# Patient Record
Sex: Male | Born: 1940 | Race: White | Marital: Married | Smoking: Former smoker
Health system: Southern US, Community
[De-identification: ages and names within clinical notes are randomized; demographics above are authoritative.]

## PROBLEM LIST (undated history)

## (undated) DIAGNOSIS — I82409 Acute embolism and thrombosis of unspecified deep veins of unspecified lower extremity: Secondary | ICD-10-CM

## (undated) DIAGNOSIS — I8390 Asymptomatic varicose veins of unspecified lower extremity: Secondary | ICD-10-CM

## (undated) DIAGNOSIS — I739 Peripheral vascular disease, unspecified: Secondary | ICD-10-CM

## (undated) DIAGNOSIS — I10 Essential (primary) hypertension: Secondary | ICD-10-CM

## (undated) DIAGNOSIS — G4700 Insomnia, unspecified: Secondary | ICD-10-CM

## (undated) DIAGNOSIS — I6529 Occlusion and stenosis of unspecified carotid artery: Secondary | ICD-10-CM

## (undated) DIAGNOSIS — D649 Anemia, unspecified: Secondary | ICD-10-CM

## (undated) DIAGNOSIS — M7071 Other bursitis of hip, right hip: Secondary | ICD-10-CM

## (undated) DIAGNOSIS — K5090 Crohn's disease, unspecified, without complications: Secondary | ICD-10-CM

## (undated) DIAGNOSIS — D126 Benign neoplasm of colon, unspecified: Secondary | ICD-10-CM

## (undated) DIAGNOSIS — R748 Abnormal levels of other serum enzymes: Secondary | ICD-10-CM

## (undated) DIAGNOSIS — N200 Calculus of kidney: Secondary | ICD-10-CM

## (undated) DIAGNOSIS — M1990 Unspecified osteoarthritis, unspecified site: Secondary | ICD-10-CM

## (undated) DIAGNOSIS — K219 Gastro-esophageal reflux disease without esophagitis: Secondary | ICD-10-CM

## (undated) HISTORY — DX: Other bursitis of hip, right hip: M70.71

## (undated) HISTORY — DX: Crohn's disease, unspecified, without complications: K50.90

## (undated) HISTORY — DX: Abnormal levels of other serum enzymes: R74.8

## (undated) HISTORY — DX: Gastro-esophageal reflux disease without esophagitis: K21.9

## (undated) HISTORY — PX: KNEE SURGERY: SHX244

## (undated) HISTORY — DX: Acute embolism and thrombosis of unspecified deep veins of unspecified lower extremity: I82.409

## (undated) HISTORY — DX: Asymptomatic varicose veins of unspecified lower extremity: I83.90

## (undated) HISTORY — DX: Insomnia, unspecified: G47.00

## (undated) HISTORY — DX: Benign neoplasm of colon, unspecified: D12.6

## (undated) HISTORY — DX: Occlusion and stenosis of unspecified carotid artery: I65.29

## (undated) HISTORY — DX: Unspecified osteoarthritis, unspecified site: M19.90

## (undated) HISTORY — PX: TONSILLECTOMY: SUR1361

## (undated) HISTORY — DX: Essential (primary) hypertension: I10

## (undated) HISTORY — PX: COLONOSCOPY: SHX174

## (undated) HISTORY — PX: COLON SURGERY: SHX602

## (undated) HISTORY — DX: Peripheral vascular disease, unspecified: I73.9

## (undated) HISTORY — DX: Anemia, unspecified: D64.9

---

## 2012-03-16 HISTORY — PX: ESOPHAGOGASTRODUODENOSCOPY: SHX1529

## 2013-04-07 ENCOUNTER — Other Ambulatory Visit: Payer: Self-pay | Admitting: *Deleted

## 2013-04-07 ENCOUNTER — Encounter: Payer: Self-pay | Admitting: Vascular Surgery

## 2013-04-07 DIAGNOSIS — I70219 Atherosclerosis of native arteries of extremities with intermittent claudication, unspecified extremity: Secondary | ICD-10-CM

## 2013-04-18 ENCOUNTER — Encounter: Payer: Self-pay | Admitting: Vascular Surgery

## 2013-04-19 ENCOUNTER — Encounter: Payer: Self-pay | Admitting: Vascular Surgery

## 2013-04-19 ENCOUNTER — Ambulatory Visit (INDEPENDENT_AMBULATORY_CARE_PROVIDER_SITE_OTHER)
Admission: RE | Admit: 2013-04-19 | Discharge: 2013-04-19 | Disposition: A | Discharge status: Home / Self Care | Payer: Medicare Other | Source: Ambulatory Visit | Attending: Vascular Surgery | Admitting: Vascular Surgery

## 2013-04-19 ENCOUNTER — Ambulatory Visit (HOSPITAL_COMMUNITY)
Admission: RE | Admit: 2013-04-19 | Discharge: 2013-04-19 | Disposition: A | Discharge status: Home / Self Care | Payer: Medicare Other | Source: Ambulatory Visit | Attending: Vascular Surgery | Admitting: Vascular Surgery

## 2013-04-19 ENCOUNTER — Ambulatory Visit (INDEPENDENT_AMBULATORY_CARE_PROVIDER_SITE_OTHER): Payer: Medicare Other | Admitting: Vascular Surgery

## 2013-04-19 VITALS — BP 135/76 | HR 53 | Wt 194.8 lb

## 2013-04-19 DIAGNOSIS — I70219 Atherosclerosis of native arteries of extremities with intermittent claudication, unspecified extremity: Secondary | ICD-10-CM

## 2013-04-19 DIAGNOSIS — I739 Peripheral vascular disease, unspecified: Secondary | ICD-10-CM | POA: Insufficient documentation

## 2013-04-19 DIAGNOSIS — Z87891 Personal history of nicotine dependence: Secondary | ICD-10-CM | POA: Insufficient documentation

## 2013-04-19 DIAGNOSIS — Z09 Encounter for follow-up examination after completed treatment for conditions other than malignant neoplasm: Secondary | ICD-10-CM | POA: Insufficient documentation

## 2013-04-19 DIAGNOSIS — I10 Essential (primary) hypertension: Secondary | ICD-10-CM | POA: Insufficient documentation

## 2013-04-19 NOTE — Assessment & Plan Note (Signed)
This patient has stable bilateral lower extremity claudication which is more significant on the right side. He has developed some recurrent stenosis in a previously stented segment of his right superficial femoral artery. Fortunately he is not a smoker. He seems highly motivated and intelligent and I think he will do well with continued exercise and a structured walking program. I've explained that the risk of reintervention is that certainly he could develop recurrent stenosis again. I've explained that we would generally reserve bypass for patients with rest pain or a nonhealing ulcer. I be happy to see him back at any time if his symptoms progress or he develops any new vascular issues.

## 2013-04-19 NOTE — Progress Notes (Signed)
Vascular and Vein Specialist of [facility]  Patient name: Ryan Richmond MRN: [id number] DOB: 1940-12-28 Sex: male  REASON FOR CONSULT: Second opinion concerning peripheral vascular disease  HPI: Ryan Richmond is a 73 y.o. male who underwent atherectomy, angioplasty and stenting of the right superficial femoral artery by Dr. Maryjean Morn in [facility] on 07/21/2012. This was for lower extremity claudication. Initially his symptoms improved, however, approximately 2-3 months later he developed recurrent right calf claudication. Follow up studies air showed evidence of recurrent disease in the stented segment. He was considering having further work but decided to hold off and then a second opinion.  He has bilateral lower extremity calf claudication. His symptoms are more significant on the right side. He develops calf pain at approximately 1/8 of a mile. His symptoms are aggravated by walking and relieved with rest. He denies any history of rest pain or nonhealing ulcers. He is not a smoker. His symptoms have been relatively stable over the last several months.  I have reviewed [place] records from [place]. On 07/21/2012, he underwent a right superficial femoral artery atherectomy with angioplasty and stenting. He had an excellent result. This was for a 70% stenosis at the adductor canal. Below that he had some mild tibial disease. On the left side, he had some mild tibial disease.  After the procedure, on 07/28/2012, he had an ABI of 100% on the right and 100% on the left. The stent was widely patent without evidence of increased velocities.   Past Medical History  Diagnosis Date  . Peripheral vascular disease   . Anemia   . Arthritis   . Benign neoplasm of colon   . Bursitis of right hip   . Chronic kidney disease   . Crohn's disease   . DVT (deep venous thrombosis)   . Esophageal reflux   . Hypertension   . Elevated liver enzymes   . Peripheral arterial disease   . Carotid artery occlusion    right carotid bruit  . Varicose veins   . Insomnia, unspecified   FAMILY HISTORY: There is no family history of premature cardiovascular disease.  SOCIAL HISTORY: History  Substance Use Topics  . Smoking status: Former Smoker    Quit date: 03/17/1983  . Smokeless tobacco: Never Used  . Alcohol Use: 1.8 oz/week    3 Shots of liquor per week   No Known Allergies Current Outpatient Prescriptions  Medication Sig Dispense Refill  . acidophilus (RISAQUAD) CAPS capsule Take by mouth daily.      Marland Kitchen aspirin 81 MG tablet Take 81 mg by mouth daily.      . cloNIDine (CATAPRES) 0.1 MG tablet Take 0.1 mg by mouth 2 (two) times daily.      . ferrous sulfate 325 (65 FE) MG tablet Take 325 mg by mouth daily with breakfast.      . Multiple Vitamins-Minerals (VITEYES AREDS ADVANCED PO) Take 1 tablet by mouth 2 (two) times daily.      Marland Kitchen olmesartan-hydrochlorothiazide (BENICAR HCT) 40-25 MG per tablet Take 1 tablet by mouth daily.      . Probiotic Product (ALIGN PO) Take 1 tablet by mouth daily.      . Turmeric Curcumin 500 MG CAPS Take 500 mg by mouth daily.      Marland Kitchen zolpidem (AMBIEN) 5 MG tablet Take 5 mg by mouth at bedtime as needed for sleep.      . rifaximin (XIFAXAN) 550 MG TABS tablet Take 550 mg by mouth 3 (three) times  daily.       No current facility-administered medications for this visit.   REVIEW OF SYSTEMS: Valu.Nieves ] denotes positive finding; [  ] denotes negative finding  CARDIOVASCULAR:  [ ]  chest pain   [ ]  chest pressure   [ ]  palpitations   [ ]  orthopnea   [ ]  dyspnea on exertion   Valu.Nieves ] claudication- bilateral   [ ]  rest pain   Valu.Nieves ] DVT   [ ]  phlebitis PULMONARY:   [ ]  productive cough   [ ]  asthma   [ ]  wheezing NEUROLOGIC:   [ ]  weakness  [ ]  paresthesias  [ ]  aphasia  [ ]  amaurosis  [ ]  dizziness HEMATOLOGIC:   [ ]  bleeding problems   [ ]  clotting disorders MUSCULOSKELETAL:  [ ]  joint pain   [ ]  joint swelling [ ]  leg swelling GASTROINTESTINAL: [ ]   blood in stool  [ ]    hematemesis GENITOURINARY:  [ ]   dysuria  [ ]   hematuria PSYCHIATRIC:  [ ]  history of major depression INTEGUMENTARY:  [ ]  rashes  [ ]  ulcers CONSTITUTIONAL:  [ ]  fever   [ ]  chills  PHYSICAL EXAM: Filed Vitals:   04/19/13 1016  BP: 135/76  Pulse: 53  Weight: 194 lb 12.8 oz (88.361 kg)  SpO2: 100%   There is no height on file to calculate BMI. GENERAL: The patient is a well-nourished male, in no acute distress. The vital signs are documented above. CARDIOVASCULAR: There is a regular rate and rhythm. I do not detect carotid bruits. He has palpable femoral pulses. Both feet are warm and well-perfused. I cannot palpate pedal pulses. PULMONARY: There is good air exchange bilaterally without wheezing or rales. ABDOMEN: Soft and non-tender with normal pitched bowel sounds.  MUSCULOSKELETAL: There are no major deformities or cyanosis. NEUROLOGIC: No focal weakness or paresthesias are detected. SKIN: There are no ulcers or rashes noted. PSYCHIATRIC: The patient has a normal affect.  DATA:  I have independently interpreted his arterial Doppler study in our office today. He has monophasic signals throughout the stented area in the right superficial femoral artery. ABIs 97% on the right and 100% on the left. Velocities in the right superficial femoral artery suggests a greater than 50% stenosis.  MEDICAL ISSUES:  Atherosclerosis of native arteries of the extremities with intermittent claudication This patient has stable bilateral lower extremity claudication which is more significant on the right side. He has developed some recurrent stenosis in a previously stented segment of his right superficial femoral artery. Fortunately he is not a smoker. He seems highly motivated and intelligent and I think he will do well with continued exercise and a structured walking program. I've explained that the risk of reintervention is that certainly he could develop recurrent stenosis again. I've explained that  we would generally reserve bypass for patients with rest pain or a nonhealing ulcer. I be happy to see him back at any time if his symptoms progress or he develops any new vascular issues.   [place] [facility] [place] Beeper: [phone]

## 2013-07-31 ENCOUNTER — Ambulatory Visit (INDEPENDENT_AMBULATORY_CARE_PROVIDER_SITE_OTHER): Payer: Medicare Other | Admitting: Interventional Cardiology

## 2013-07-31 ENCOUNTER — Encounter: Payer: Self-pay | Admitting: Interventional Cardiology

## 2013-07-31 VITALS — BP 124/60 | HR 63 | Ht 72.0 in | Wt 194.0 lb

## 2013-07-31 DIAGNOSIS — I70219 Atherosclerosis of native arteries of extremities with intermittent claudication, unspecified extremity: Secondary | ICD-10-CM

## 2013-07-31 DIAGNOSIS — I739 Peripheral vascular disease, unspecified: Secondary | ICD-10-CM

## 2013-07-31 DIAGNOSIS — I10 Essential (primary) hypertension: Secondary | ICD-10-CM | POA: Insufficient documentation

## 2013-07-31 DIAGNOSIS — R0989 Other specified symptoms and signs involving the circulatory and respiratory systems: Secondary | ICD-10-CM

## 2013-07-31 NOTE — Patient Instructions (Signed)
Your physician recommends that you continue on your current medications as directed. Please refer to the Current Medication list given to you today.  We will request a copy of your nuclear stress test form [facility]  Your total cholesterol goal should be less than 170, LDL 70 or less  Your physician recommends that you schedule a follow-up appointment as needed

## 2013-07-31 NOTE — Progress Notes (Signed)
Patient ID: Ryan Richmond, male   DOB: 04-18-1940, 73 y.o.   MRN: [id number]   Date: 07/31/2013 ID: Ryan Richmond, DOB 04/07/1940, MRN [id number] PCP: Doug Sou B  Reason: Claudication  ASSESSMENT;  1. Peripheral vascular disease with right greater than left lower extremity claudication 2. Vague, at times continuous chest discomfort that can last for days, located in the left pectoral region.  3. Hypertension 4. Hyperlipidemia  PLAN:  1. Pharmacologic nuclear stress test [place] within the past 6 months. Our review the results. Not on any new testing until that has been reviewed. 2. Risk factor modification including achieving an LDL cholesterol of 70 or lower 3. Aerobic activity 4. May consider anatomic evaluation of the coronaries with CT angioma or cardiac cath depending upon my review of his nuclear study. If it is truly normal, no further evaluation at this time and just clinical followup   SUBJECTIVE: Ryan Richmond is a 73 y.o. male who is here for evaluation of vague left pectoral chest discomfort. Is not exertion related. There is no associated dyspnea , radiation, or other complaints. He has had off and on for 18 months. He had a stress nuclear study performed at [facility] about 6 months ago that was "normal". He is here for second opinion. The quality of the discomfort has not changed over the last 1-1/2 years. He plays softball. His only limitation with walking in physical activity his right calf claudication. He has a history of right superficial femoral artery stenting. He has bilateral lower extremity claudication. Claudication gives him trouble although with prolonged walking up inclines. He is able to run first base without difficulty. No neurological complaints. No history of cerebrovascular disease   No Known Allergies  Current Outpatient Prescriptions on File Prior to Visit  Medication Sig Dispense Refill  . aspirin 81 MG tablet Take 81  mg by mouth daily.      . cloNIDine (CATAPRES) 0.1 MG tablet Take 0.05 mg by mouth 2 (two) times daily.       . ferrous sulfate 325 (65 FE) MG tablet Take 325 mg by mouth daily with breakfast.      . Multiple Vitamins-Minerals (VITEYES AREDS ADVANCED PO) Take 1 tablet by mouth 2 (two) times daily.      Marland Kitchen olmesartan-hydrochlorothiazide (BENICAR HCT) 40-25 MG per tablet Take 1 tablet by mouth daily.      . Turmeric Curcumin 500 MG CAPS Take 500 mg by mouth daily.      Marland Kitchen zolpidem (AMBIEN) 5 MG tablet Take 5 mg by mouth at bedtime as needed for sleep.       No current facility-administered medications on file prior to visit.    Past Medical History  Diagnosis Date  . Peripheral vascular disease   . Anemia   . Arthritis   . Benign neoplasm of colon   . Bursitis of right hip   . Chronic kidney disease   . Crohn's disease   . DVT (deep venous thrombosis)   . Esophageal reflux   . Hypertension   . Elevated liver enzymes   . Peripheral arterial disease   . Carotid artery occlusion     right carotid bruit  . Varicose veins   . Insomnia, unspecified     Past Surgical History  Procedure Laterality Date  . Colonoscopy  2009 and 2011  . Esophagogastroduodenoscopy  january  2014  . Knee surgery Left   . Tonsillectomy      History  Social History  . Marital Status: Married    Spouse Name: N/A    Number of Children: N/A  . Years of Education: N/A   Occupational History  . Not on file.   Social History Main Topics  . Smoking status: Former Smoker    Quit date: 03/17/1983  . Smokeless tobacco: Never Used  . Alcohol Use: 1.8 oz/week    3 Shots of liquor per week  . Drug Use: No  . Sexual Activity: Not on file   Other Topics Concern  . Not on file   Social History Narrative  . No narrative on file    No family history on file.  ROS: No lower extremity sores. No history of cardiac events. Denies syncope. Denies tachycardia palpitations.. Other systems negative for  complaints.  OBJECTIVE: BP 124/60  Pulse 63  Ht 6' (1.829 m)  Wt 194 lb (87.998 kg)  BMI 26.31 kg/m2,  General: No acute distress, appears healthy and younger than stated age 51: normal  Neck: JVD flat. Carotids absent Chest: Clear Cardiac: Murmur: Absent. Gallop: Absent. Rhythm: Normal. Other: Normal Abdomen: Bruit: Normal. Pulsation: Absent Extremities: Edema: Absent to trace bilateral. Pulses: 2+ and symmetric Neuro: Normal Psych: Normal  ECG: Normal

## 2018-05-25 ENCOUNTER — Emergency Department (HOSPITAL_BASED_OUTPATIENT_CLINIC_OR_DEPARTMENT_OTHER): Payer: Medicare Other

## 2018-05-25 ENCOUNTER — Observation Stay (HOSPITAL_BASED_OUTPATIENT_CLINIC_OR_DEPARTMENT_OTHER)
Admission: EM | Admit: 2018-05-25 | Discharge: 2018-05-26 | Disposition: A | Discharge status: Home / Self Care | Payer: Medicare Other | Attending: Internal Medicine | Admitting: Internal Medicine

## 2018-05-25 ENCOUNTER — Other Ambulatory Visit: Payer: Self-pay

## 2018-05-25 ENCOUNTER — Encounter (HOSPITAL_BASED_OUTPATIENT_CLINIC_OR_DEPARTMENT_OTHER): Payer: Self-pay

## 2018-05-25 DIAGNOSIS — Z7982 Long term (current) use of aspirin: Secondary | ICD-10-CM | POA: Diagnosis not present

## 2018-05-25 DIAGNOSIS — I10 Essential (primary) hypertension: Secondary | ICD-10-CM | POA: Diagnosis present

## 2018-05-25 DIAGNOSIS — R079 Chest pain, unspecified: Secondary | ICD-10-CM | POA: Diagnosis present

## 2018-05-25 DIAGNOSIS — R072 Precordial pain: Secondary | ICD-10-CM | POA: Diagnosis not present

## 2018-05-25 DIAGNOSIS — I739 Peripheral vascular disease, unspecified: Secondary | ICD-10-CM | POA: Insufficient documentation

## 2018-05-25 DIAGNOSIS — Z87891 Personal history of nicotine dependence: Secondary | ICD-10-CM | POA: Diagnosis not present

## 2018-05-25 DIAGNOSIS — Z79899 Other long term (current) drug therapy: Secondary | ICD-10-CM | POA: Insufficient documentation

## 2018-05-25 DIAGNOSIS — R0789 Other chest pain: Secondary | ICD-10-CM

## 2018-05-25 HISTORY — DX: Calculus of kidney: N20.0

## 2018-05-25 LAB — BASIC METABOLIC PANEL
Anion gap: 8 (ref 5–15)
BUN: 21 mg/dL (ref 8–23)
CHLORIDE: 104 mmol/L (ref 98–111)
CO2: 23 mmol/L (ref 22–32)
Calcium: 9.2 mg/dL (ref 8.9–10.3)
Creatinine, Ser: 1.34 mg/dL — ABNORMAL HIGH (ref 0.61–1.24)
GFR, EST AFRICAN AMERICAN: 60 mL/min — AB (ref >60)
GFR, EST NON AFRICAN AMERICAN: 51 mL/min — AB (ref >60)
Glucose, Bld: 141 mg/dL — ABNORMAL HIGH (ref 70–99)
POTASSIUM: 3.8 mmol/L (ref 3.5–5.1)
Sodium: 135 mmol/L (ref 135–145)

## 2018-05-25 LAB — CBC
HEMATOCRIT: 42.6 % (ref 39.0–52.0)
HEMOGLOBIN: 13.9 g/dL (ref 13.0–17.0)
MCH: 30.9 pg (ref 26.0–34.0)
MCHC: 32.6 g/dL (ref 30.0–36.0)
MCV: 94.7 fL (ref 80.0–100.0)
Platelets: 209 10*3/uL (ref 150–400)
RBC: 4.5 MIL/uL (ref 4.22–5.81)
RDW: 12.6 % (ref 11.5–15.5)
WBC: 7.5 10*3/uL (ref 4.0–10.5)
nRBC: 0 % (ref 0.0–0.2)

## 2018-05-25 LAB — TROPONIN I: Troponin I: <0.03 ng/mL (ref <0.03)

## 2018-05-25 MED ORDER — ATORVASTATIN CALCIUM 10 MG PO TABS
10.0000 mg | ORAL_TABLET | Freq: Every day | ORAL | Status: DC
  Administered 2018-05-25 – 2018-05-26 (×2): 10 mg via ORAL
  Filled 2018-05-25 (×2): qty 1

## 2018-05-25 MED ORDER — OLMESARTAN MEDOXOMIL-HCTZ 40-25 MG PO TABS
1.0000 | ORAL_TABLET | Freq: Every day | ORAL | Status: DC
Start: 2018-05-25 — End: 2018-05-25

## 2018-05-25 MED ORDER — ONDANSETRON HCL 4 MG/2ML IJ SOLN: 4.0000 mg | Freq: Four times a day (QID) | INTRAMUSCULAR | Status: DC | PRN

## 2018-05-25 MED ORDER — HEPARIN (PORCINE) 25000 UT/250ML-% IV SOLN
1250.0000 [IU]/h | INTRAVENOUS | Status: DC
  Administered 2018-05-25: 1100 [IU]/h via INTRAVENOUS
  Filled 2018-05-25: qty 250

## 2018-05-25 MED ORDER — ACETAMINOPHEN 325 MG PO TABS: 650.0000 mg | ORAL_TABLET | ORAL | Status: DC | PRN

## 2018-05-25 MED ORDER — ASPIRIN EC 81 MG PO TBEC
81.0000 mg | DELAYED_RELEASE_TABLET | Freq: Every day | ORAL | Status: DC
  Administered 2018-05-26: 81 mg via ORAL
  Filled 2018-05-25: qty 1

## 2018-05-25 MED ORDER — FINASTERIDE 5 MG PO TABS
5.0000 mg | ORAL_TABLET | Freq: Every day | ORAL | Status: DC
  Administered 2018-05-26: 5 mg via ORAL
  Filled 2018-05-25 (×2): qty 1

## 2018-05-25 MED ORDER — TURMERIC CURCUMIN 500 MG PO CAPS: 500.0000 mg | ORAL_CAPSULE | Freq: Every day | ORAL | Status: DC

## 2018-05-25 MED ORDER — SODIUM CHLORIDE 0.9% FLUSH
3.0000 mL | Freq: Once | INTRAVENOUS | Status: DC
  Filled 2018-05-25: qty 3

## 2018-05-25 MED ORDER — FAMOTIDINE 20 MG PO TABS
40.0000 mg | ORAL_TABLET | Freq: Every day | ORAL | Status: DC
  Administered 2018-05-26: 40 mg via ORAL
  Filled 2018-05-25: qty 2

## 2018-05-25 MED ORDER — CLONIDINE HCL 0.1 MG PO TABS
0.0500 mg | ORAL_TABLET | Freq: Two times a day (BID) | ORAL | Status: DC
  Administered 2018-05-25 – 2018-05-26 (×2): 0.05 mg via ORAL
  Filled 2018-05-25 (×3): qty 1

## 2018-05-25 MED ORDER — ZOLPIDEM TARTRATE 5 MG PO TABS
5.0000 mg | ORAL_TABLET | Freq: Every evening | ORAL | Status: DC | PRN
  Administered 2018-05-25: 5 mg via ORAL
  Filled 2018-05-25: qty 1

## 2018-05-25 MED ORDER — ASPIRIN 81 MG PO CHEW
324.0000 mg | CHEWABLE_TABLET | Freq: Once | ORAL | Status: AC
Start: 2018-05-25 — End: 2018-05-25
  Administered 2018-05-25: 324 mg via ORAL
  Filled 2018-05-25: qty 4

## 2018-05-25 MED ORDER — HYDROCHLOROTHIAZIDE 25 MG PO TABS
25.0000 mg | ORAL_TABLET | Freq: Every day | ORAL | Status: DC
  Administered 2018-05-26: 25 mg via ORAL
  Filled 2018-05-25: qty 1

## 2018-05-25 MED ORDER — IRBESARTAN 150 MG PO TABS
300.0000 mg | ORAL_TABLET | Freq: Every day | ORAL | Status: DC
  Administered 2018-05-26: 300 mg via ORAL
  Filled 2018-05-25: qty 2

## 2018-05-25 NOTE — ED Notes (Signed)
ED TO INPATIENT HANDOFF REPORT  ED Nurse Name and Phone #:Tata Lilia Pro [phone]  S Name/Age/Gender Ryan Richmond 78 y.o. male Room/Bed: MH10/MH10  Code Status   Code Status: Not on file  Home/SNF/Other  Home Is this baseline? yes  Triage Complete: Triage complete  Chief Complaint chest pain  Triage Note C/o left side chest pressure x 3 weeks-NAD-steady gait   Allergies No Known Allergies  Level of Care/Admitting Diagnosis ED Disposition    ED Disposition Condition [place]: [place] [[id number]]  Level of Care: Cardiac Telemetry [103]  I expect the patient will be discharged within 24 hours: Yes  LOW acuity---Tx typically complete <24 hrs---ACUTE conditions typically can be evaluated <24 hours---LABS likely to return to acceptable levels <24 hours---IS near functional baseline---EXPECTED to return to current living arrangement---NOT newly hypoxic: Meets criteria for 5C-Observation unit  Diagnosis: Chest pain [098119]  Admitting Physician: Karmen Bongo [[id number]]  Attending Physician: Karmen Bongo [[id number]]  PT Class (Do Not Modify): Observation [104]  PT Acc Code (Do Not Modify): Observation [10022]       B Medical/Surgery History Past Medical History:  Diagnosis Date  . Anemia   . Arthritis   . Benign neoplasm of colon   . Bursitis of right hip   . Carotid artery occlusion    right carotid bruit  . Crohn's disease ([place])   . DVT (deep venous thrombosis) ([place])   . Elevated liver enzymes   . Esophageal reflux   . Hypertension   . Insomnia, unspecified   . Kidney stone   . Peripheral arterial disease ([place])   . Peripheral vascular disease ([place])   . Varicose veins    Past Surgical History:  Procedure Laterality Date  . COLON SURGERY    . COLONOSCOPY  2009 and 2011  . ESOPHAGOGASTRODUODENOSCOPY  january  2014  . KNEE SURGERY Left   . TONSILLECTOMY       A IV Location/Drains/Wounds Patient  Lines/Drains/Airways Status   Active Line/Drains/Airways    Name:   Placement date:   Placement time:   Site:   Days:   Peripheral IV 05/25/18 Left Antecubital   05/25/18    1401    Antecubital   less than 1          Intake/Output Last 24 hours No intake or output data in the 24 hours ending 05/25/18 1840  Labs/Imaging Results for orders placed or performed during the hospital encounter of 05/25/18 (from the past 48 hour(s))  Basic metabolic panel     Status: Abnormal   Collection Time: 05/25/18  2:04 PM  Result Value Ref Range   Sodium 135 135 - 145 mmol/L   Potassium 3.8 3.5 - 5.1 mmol/L   Chloride 104 98 - 111 mmol/L   CO2 23 22 - 32 mmol/L   Glucose, Bld 141 (H) 70 - 99 mg/dL   BUN 21 8 - 23 mg/dL   Creatinine, Ser 1.34 (H) 0.61 - 1.24 mg/dL   Calcium 9.2 8.9 - 10.3 mg/dL   GFR calc non Af Amer 51 (L) >60 mL/min   GFR calc Af Amer 60 (L) >60 mL/min   Anion gap 8 5 - 15    Comment: Performed at [facility], [address] [place], [place] [address]  CBC     Status: None   Collection Time: 05/25/18  2:04 PM  Result Value Ref Range   WBC 7.5 4.0 - 10.5 K/uL  RBC 4.50 4.22 - 5.81 MIL/uL   Hemoglobin 13.9 13.0 - 17.0 g/dL   HCT 42.6 39.0 - 52.0 %   MCV 94.7 80.0 - 100.0 fL   MCH 30.9 26.0 - 34.0 pg   MCHC 32.6 30.0 - 36.0 g/dL   RDW 12.6 11.5 - 15.5 %   Platelets 209 150 - 400 K/uL   nRBC 0.0 0.0 - 0.2 %    Comment: Performed at [facility], [address] [place], [place] [address]  Troponin I - ONCE - STAT     Status: None   Collection Time: 05/25/18  2:04 PM  Result Value Ref Range   Troponin I <0.03 <0.03 ng/mL    Comment: Performed at [facility], [address] [place], [place] [address]   Dg Chest 2 View  Result Date: 05/25/2018 CLINICAL DATA:  Subacute onset of left chest pressure for 3 weeks. EXAM: CHEST - 2 VIEW COMPARISON:  None. FINDINGS: The lungs are well-aerated. Mild peribronchial thickening is noted. There is  no evidence of focal opacification, pleural effusion or pneumothorax. The heart is normal in size; the mediastinal contour is within normal limits. No acute osseous abnormalities are seen. IMPRESSION: Mild peribronchial thickening noted. Lungs otherwise clear. Electronically Signed   By: Garald Balding M.D.   On: 05/25/2018 14:00    Pending Labs Unresulted Labs (From admission, onward)   None      Vitals/Pain Today's Vitals   05/25/18 1448 05/25/18 1500 05/25/18 1655 05/25/18 1817  BP:  (!) 156/82  (!) 146/80  Pulse:  69  62  Resp:  19  18  Temp:    97.8 F (36.6 C)  TempSrc:    Oral  SpO2:  100%  100%  Weight:      Height:      PainSc: 3   3      Isolation Precautions No active isolations  Medications Medications  sodium chloride flush (NS) 0.9 % injection 3 mL (3 mLs Intravenous Not Given 05/25/18 1400)  aspirin chewable tablet 324 mg (324 mg Oral Given 05/25/18 1502)    Mobility walks Low fall risk   Focused Assessments Sinus rhythm of monitor   R Recommendations: See Admitting Provider Note  Report given to: Minette Brine, RN  Additional Notes:  Still complaint of left chest pressure.

## 2018-05-25 NOTE — Progress Notes (Signed)
ANTICOAGULATION CONSULT NOTE - Initial Consult  Pharmacy Consult for heparin Indication: chest pain/ACS  No Known Allergies  Patient Measurements: Height: 5\' 11"  (180.3 cm) Weight: 191 lb 8 oz (86.9 kg) IBW/kg (Calculated) : 75.3 Heparin Dosing Weight: 87 kg   Vital Signs: Temp: 98 F (36.7 C) (03/11 2053) Temp Source: Oral (03/11 2053) BP: 185/74 (03/11 2053) Pulse Rate: 65 (03/11 2053)  Labs: Recent Labs    05/25/18 1404  HGB 13.9  HCT 42.6  PLT 209  CREATININE 1.34*  TROPONINI <0.03    Estimated Creatinine Clearance: 50.7 mL/min (A) (by C-G formula based on SCr of 1.34 mg/dL (H)).   Medical History: Past Medical History:  Diagnosis Date  . Anemia   . Arthritis   . Benign neoplasm of colon   . Bursitis of right hip   . Carotid artery occlusion    right carotid bruit  . Crohn's disease ([place])   . DVT (deep venous thrombosis) ([place])   . Elevated liver enzymes   . Esophageal reflux   . Hypertension   . Insomnia, unspecified   . Kidney stone   . Peripheral arterial disease ([place])   . Peripheral vascular disease ([place])   . Varicose veins     Medications:  Medications Prior to Admission  Medication Sig Dispense Refill Last Dose  . atorvastatin (LIPITOR) 10 MG tablet Take 10 mg by mouth daily.   05/24/2018 at 2200  . cloNIDine (CATAPRES) 0.1 MG tablet Take 0.05 mg by mouth 2 (two) times daily.    05/25/2018 at 0630  . famotidine (PEPCID) 40 MG tablet Take 40 mg by mouth daily.   05/25/2018 at 0630  . finasteride (PROSCAR) 5 MG tablet TAKE 1 TABLET BY MOUTH EVERY DAY     . olmesartan-hydrochlorothiazide (BENICAR HCT) 40-25 MG per tablet Take 1 tablet by mouth daily.   05/25/2018 at 0630  . Turmeric Curcumin 500 MG CAPS Take 500 mg by mouth daily.   05/25/2018 at 0630  . aspirin 81 MG tablet Take 81 mg by mouth daily.   Unknown at Unknown time  . ferrous sulfate 325 (65 FE) MG tablet Take 325 mg by mouth daily with breakfast.   Taking  . Multiple Vitamins-Minerals  (VITEYES AREDS ADVANCED PO) Take 1 tablet by mouth 2 (two) times daily.   Taking  . Probiotic Product ([place]) Take by mouth daily.   Taking  . zolpidem (AMBIEN) 5 MG tablet Take 5 mg by mouth at bedtime as needed for sleep.   05/16/2018    Assessment: 77 YOM with chest pain x 3 weeks. Of note he is on Xarelto at home but has not taken his dose today. Pharmacy consulted to start IV heparin for ACS. H/H and Plt wnl   Goal of Therapy:  Heparin level 0.3-0.7 units/ml Monitor platelets by anticoagulation protocol: Yes   Plan:  -Start IV heparin 1100 units/hr. No bolus -F/u 8 hr HL/aPTT -Monitor daily aPTT/HL, CBC and s/s of bleeding   Albertina Parr, PharmD., BCPS Clinical Pharmacist Clinical phone for 05/25/18 until 10:30pm: [phone] If after 10:30pm, please refer to [facility] for unit-specific pharmacist

## 2018-05-25 NOTE — Plan of Care (Signed)
  Problem: Health Behavior/Discharge Planning: Goal: Ability to manage health-related needs will improve Outcome: Progressing   Problem: Clinical Measurements: Goal: Ability to maintain clinical measurements within normal limits will improve Outcome: Progressing Goal: Will remain free from infection Outcome: Progressing   Problem: Clinical Measurements: Goal: Will remain free from infection Outcome: Progressing

## 2018-05-25 NOTE — ED Notes (Signed)
ED Provider at bedside. 

## 2018-05-25 NOTE — ED Provider Notes (Signed)
[facility] EMERGENCY DEPARTMENT Provider Note   CSN: [id number] Arrival date & time: 05/25/18  1326    History   Chief Complaint Chief Complaint  Patient presents with   Chest Pain    HPI Ryan Richmond is a 78 y.o. male.     Patient with a complaint of chest pain on and off for approximately 3 weeks.  Described as a pressure but then this morning it was more intense.  Is left substernal area and left chest.  Not made worse by movement of the arm does not go into the back.  No other symptoms with it.  Patient has a history of hypertension and peripheral vascular disease.  Patient is on the blood thinner Xarelto.  No shortness of breath with it.  Pain here spontaneously resolved.  But had pain for several hours today.     Past Medical History:  Diagnosis Date   Anemia    Arthritis    Benign neoplasm of colon    Bursitis of right hip    Carotid artery occlusion    right carotid bruit   Crohn's disease ([place])    DVT (deep venous thrombosis) (HCC)    Elevated liver enzymes    Esophageal reflux    Hypertension    Insomnia, unspecified    Kidney stone    Peripheral arterial disease ([place])    Peripheral vascular disease ([place])    Varicose veins     Patient Active Problem List   Diagnosis Date Noted   Claudication ([place]) 07/31/2013   Carotid bruit 07/31/2013   Essential hypertension 07/31/2013   Atherosclerosis of native arteries of the extremities with intermittent claudication 04/19/2013    Past Surgical History:  Procedure Laterality Date   COLON SURGERY     COLONOSCOPY  2009 and 2011   ESOPHAGOGASTRODUODENOSCOPY  january  2014   KNEE SURGERY Left    TONSILLECTOMY          Home Medications    Prior to Admission medications   Medication Sig Start Date End Date Taking? Authorizing Provider  atorvastatin (LIPITOR) 10 MG tablet Take 10 mg by mouth daily.   Yes [provider]  cloNIDine (CATAPRES) 0.1 MG tablet Take  0.05 mg by mouth 2 (two) times daily.    Yes [provider]  famotidine (PEPCID) 40 MG tablet Take 40 mg by mouth daily.   Yes [provider]  finasteride (PROSCAR) 5 MG tablet TAKE 1 TABLET BY MOUTH EVERY DAY 09/14/16  Yes [provider]  olmesartan-hydrochlorothiazide (BENICAR HCT) 40-25 MG per tablet Take 1 tablet by mouth daily.   Yes [provider]  Turmeric Curcumin 500 MG CAPS Take 500 mg by mouth daily.   Yes [provider]  aspirin 81 MG tablet Take 81 mg by mouth daily.    [provider]  ferrous sulfate 325 (65 FE) MG tablet Take 325 mg by mouth daily with breakfast.    [provider]  Multiple Vitamins-Minerals (VITEYES AREDS ADVANCED PO) Take 1 tablet by mouth 2 (two) times daily.    [provider]  Probiotic Product ([place]) Take by mouth daily.    [provider]  zolpidem (AMBIEN) 5 MG tablet Take 5 mg by mouth at bedtime as needed for sleep.    [provider]    Family History Family History  Problem Relation Age of Onset   Heart disease Father     Social History Social History  Tobacco Use   Smoking status: Former Smoker    Last attempt to quit: 03/17/1983    Years since quitting: 35.2   Smokeless tobacco: Never Used  Substance Use Topics   Alcohol use: Yes    Comment: occ   Drug use: No     Allergies   Patient has no known allergies.   Review of Systems Review of Systems  Constitutional: Negative for chills and fever.  HENT: Negative for rhinorrhea and sore throat.   Eyes: Negative for visual disturbance.  Respiratory: Negative for cough and shortness of breath.   Cardiovascular: Positive for chest pain. Negative for leg swelling.  Gastrointestinal: Negative for abdominal pain, diarrhea, nausea and vomiting.  Genitourinary: Negative for dysuria.  Musculoskeletal: Negative for back pain and neck pain.  Skin: Negative for rash.    Neurological: Negative for dizziness, light-headedness and headaches.  Hematological: Bruises/bleeds easily.  Psychiatric/Behavioral: Negative for confusion.     Physical Exam Updated Vital Signs BP (!) 156/82    Pulse 69    Temp 98.1 F (36.7 C) (Oral)    Resp 19    Ht 1.803 m (5\' 11" )    Wt 89.4 kg    SpO2 100%    BMI 27.48 kg/m   Physical Exam Vitals signs and nursing note reviewed.  Constitutional:      Appearance: He is well-developed.  HENT:     Head: Normocephalic and atraumatic.  Eyes:     Conjunctiva/sclera: Conjunctivae normal.  Neck:     Musculoskeletal: Normal range of motion and neck supple.  Cardiovascular:     Rate and Rhythm: Normal rate and regular rhythm.     Heart sounds: No murmur.  Pulmonary:     Effort: Pulmonary effort is normal. No respiratory distress.     Breath sounds: Normal breath sounds.  Chest:     Chest wall: No tenderness.  Abdominal:     General: Bowel sounds are normal.     Palpations: Abdomen is soft.     Tenderness: There is no abdominal tenderness.  Musculoskeletal: Normal range of motion.        General: No swelling.  Skin:    General: Skin is warm and dry.     Capillary Refill: Capillary refill takes less than 2 seconds.  Neurological:     General: No focal deficit present.     Mental Status: He is alert and oriented to person, place, and time.      ED Treatments / Results  Labs (all labs ordered are listed, but only abnormal results are displayed) Labs Reviewed  BASIC METABOLIC PANEL - Abnormal; Notable for the following components:      Result Value   Glucose, Bld 141 (*)    Creatinine, Ser 1.34 (*)    GFR calc non Af Amer 51 (*)    GFR calc Af Amer 60 (*)    All other components within normal limits  CBC  TROPONIN I    EKG EKG Interpretation  Date/Time:  Wednesday May 25 2018 13:31:46 EDT Ventricular Rate:  67 PR Interval:  180 QRS Duration: 100 QT Interval:  428 QTC Calculation: 452 R  Axis:   43 Text Interpretation:  Normal sinus rhythm Normal ECG No previous ECGs available Confirmed by Fredia Sorrow [phone]) on 05/25/2018 1:36:00 PM   Radiology Dg Chest 2 View  Result Date: 05/25/2018 CLINICAL DATA:  Subacute onset of left chest pressure for 3 weeks. EXAM: CHEST - 2 VIEW COMPARISON:  None. FINDINGS: The  lungs are well-aerated. Mild peribronchial thickening is noted. There is no evidence of focal opacification, pleural effusion or pneumothorax. The heart is normal in size; the mediastinal contour is within normal limits. No acute osseous abnormalities are seen. IMPRESSION: Mild peribronchial thickening noted. Lungs otherwise clear. Electronically Signed   By: Garald Balding M.D.   On: 05/25/2018 14:00    Procedures Procedures (including critical care time)  Medications Ordered in ED Medications  sodium chloride flush (NS) 0.9 % injection 3 mL (3 mLs Intravenous Not Given 05/25/18 1400)  aspirin chewable tablet 324 mg (324 mg Oral Given 05/25/18 1502)     Initial Impression / Assessment and Plan / ED Course  I have reviewed the triage vital signs and the nursing notes.  Pertinent labs & imaging results that were available during my care of the patient were reviewed by me and considered in my medical decision making (see chart for details).       Patient's heart score is 5.  Patient with cardiac risk factors.  Pain is now resolved.  Patient was given aspirin however the pain resolved before that.  Patient is EKG without any acute changes.  Chest x-ray negative initial troponin negative.  But symptoms concerning for an acute cardiac syndrome.  Based on his risk factors heart score 5 patient needs admission and rule out.  Discussed with hospitalist who will admit to [facility].   Final Clinical Impressions(s) / ED Diagnoses   Final diagnoses:  Precordial pain    ED Discharge Orders    None       Fredia Sorrow, MD 05/25/18 1615

## 2018-05-25 NOTE — H&P (Signed)
History and Physical   [name] OFB:[id number] DOB: 07-20-1940 DOA: 05/25/2018  Referring MD/NP/PA: Dr. Bobby Rumpf  PCP: Nicola Girt, DO   Outpatient Specialists: None  Patient coming from: Home  Chief Complaint: Chest pain  HPI: Ryan Richmond is a 78 y.o. male with medical history significant of peripheral vascular disease, hypertension, hyperlipidemia who was seen at [facility] secondary to left-sided chest pain on and off for 3 weeks but got worse today.  Pain rated as 7 out of 10.  More pressure.  No radiation.  Worsened by activities not relieved by rest.  Patient had no prior history of coronary artery disease.  No significant family history but has risk factors as indicated above.  He was therefore sent over for rule out MI.  He is still having 1 out of 10 pain at the moment.  Responded to nitroglycerin.  Patient denied any significant history of GERD..  ED Course: Blood pressure is 185/74 otherwise the rest of the vitals are stable.  BUN and creatinine is 21 and 1.34.  The regular CBC and chemistry appeared to be normal.  Initial cardiac enzymes negative.  EKG showed no significant findings.  Patient being admitted for observation and MI ruled out.  Review of Systems: As per HPI otherwise 10 point review of systems negative.    Past Medical History:  Diagnosis Date  . Anemia   . Arthritis   . Benign neoplasm of colon   . Bursitis of right hip   . Carotid artery occlusion    right carotid bruit  . Crohn's disease ([place])   . DVT (deep venous thrombosis) ([place])   . Elevated liver enzymes   . Esophageal reflux   . Hypertension   . Insomnia, unspecified   . Kidney stone   . Peripheral arterial disease ([place])   . Peripheral vascular disease ([place])   . Varicose veins     Past Surgical History:  Procedure Laterality Date  . COLON SURGERY    . COLONOSCOPY  2009 and 2011  . ESOPHAGOGASTRODUODENOSCOPY  january  2014  . KNEE SURGERY Left   . TONSILLECTOMY       reports that he quit smoking about 35 years ago. He has never used smokeless tobacco. He reports current alcohol use. He reports that he does not use drugs.  No Known Allergies  Family History  Problem Relation Age of Onset  . Heart disease Father      Prior to Admission medications   Medication Sig Start Date End Date Taking? Authorizing Provider  atorvastatin (LIPITOR) 10 MG tablet Take 10 mg by mouth daily.   Yes [provider]  cloNIDine (CATAPRES) 0.1 MG tablet Take 0.05 mg by mouth 2 (two) times daily.    Yes [provider]  famotidine (PEPCID) 40 MG tablet Take 40 mg by mouth daily.   Yes [provider]  finasteride (PROSCAR) 5 MG tablet TAKE 1 TABLET BY MOUTH EVERY DAY 09/14/16  Yes [provider]  olmesartan-hydrochlorothiazide (BENICAR HCT) 40-25 MG per tablet Take 1 tablet by mouth daily.   Yes [provider]  Turmeric Curcumin 500 MG CAPS Take 500 mg by mouth daily.   Yes [provider]  aspirin 81 MG tablet Take 81 mg by mouth daily.    [provider]  ferrous sulfate 325 (65 FE) MG tablet Take 325 mg by mouth daily with breakfast.    [provider]  Multiple Vitamins-Minerals (VITEYES AREDS ADVANCED PO) Take  1 tablet by mouth 2 (two) times daily.    [provider]  Probiotic Product ([place]) Take by mouth daily.    [provider]  zolpidem (AMBIEN) 5 MG tablet Take 5 mg by mouth at bedtime as needed for sleep.    [provider]    Physical Exam: Vitals:   05/25/18 1430 05/25/18 1500 05/25/18 1817 05/25/18 2053  BP: 122/80 (!) 156/82 (!) 146/80 (!) 185/74  Pulse: 63 69 62 65  Resp: 18 19 18 18   Temp:   97.8 F (36.6 C) 98 F (36.7 C)  TempSrc:   Oral Oral  SpO2: 100% 100% 100% 100%  Weight:    86.9 kg  Height:    5\' 11"  (1.803 m)      Constitutional: NAD, calm, comfortable Vitals:   05/25/18 1430 05/25/18 1500 05/25/18 1817 05/25/18  2053  BP: 122/80 (!) 156/82 (!) 146/80 (!) 185/74  Pulse: 63 69 62 65  Resp: 18 19 18 18   Temp:   97.8 F (36.6 C) 98 F (36.7 C)  TempSrc:   Oral Oral  SpO2: 100% 100% 100% 100%  Weight:    86.9 kg  Height:    5\' 11"  (1.803 m)   Eyes: PERRL, lids and conjunctivae normal ENMT: Mucous membranes are moist. Posterior pharynx clear of any exudate or lesions.Normal dentition.  Neck: normal, supple, no masses, no thyromegaly Respiratory: clear to auscultation bilaterally, no wheezing, no crackles. Normal respiratory effort. No accessory muscle use.  Cardiovascular: Regular rate and rhythm, no murmurs / rubs / gallops. No extremity edema. 2+ pedal pulses. No carotid bruits.  Abdomen: no tenderness, no masses palpated. No hepatosplenomegaly. Bowel sounds positive.  Musculoskeletal: no clubbing / cyanosis. No joint deformity upper and lower extremities. Good ROM, no contractures. Normal muscle tone.  Skin: no rashes, lesions, ulcers. No induration Neurologic: CN 2-12 grossly intact. Sensation intact, DTR normal. Strength 5/5 in all 4.  Psychiatric: Normal judgment and insight. Alert and oriented x 3. Normal mood.     Labs on Admission: I have personally reviewed following labs and imaging studies  CBC: Recent Labs  Lab 05/25/18 1404 05/26/18 0255  WBC 7.5 8.2  HGB 13.9 13.2  HCT 42.6 38.9*  MCV 94.7 91.1  PLT 209 170   Basic Metabolic Panel: Recent Labs  Lab 05/25/18 1404  NA 135  K 3.8  CL 104  CO2 23  GLUCOSE 141*  BUN 21  CREATININE 1.34*  CALCIUM 9.2   GFR: Estimated Creatinine Clearance: 50.7 mL/min (A) (by C-G formula based on SCr of 1.34 mg/dL (H)). Liver Function Tests: No results for input(s): AST, ALT, ALKPHOS, BILITOT, PROT, ALBUMIN in the last 168 hours. No results for input(s): LIPASE, AMYLASE in the last 168 hours. No results for input(s): AMMONIA in the last 168 hours. Coagulation Profile: No results for input(s): INR, PROTIME in the last 168 hours.  Cardiac Enzymes: Recent Labs  Lab 05/25/18 1404 05/25/18 2118 05/25/18 2355 05/26/18 0255  TROPONINI <0.03 <0.03 <0.03 <0.03   BNP (last 3 results) No results for input(s): PROBNP in the last 8760 hours. HbA1C: No results for input(s): HGBA1C in the last 72 hours. CBG: No results for input(s): GLUCAP in the last 168 hours. Lipid Profile: No results for input(s): CHOL, HDL, LDLCALC, TRIG, CHOLHDL, LDLDIRECT in the last 72 hours. Thyroid Function Tests: No results for input(s): TSH, T4TOTAL, FREET4, T3FREE, THYROIDAB in the last 72 hours. Anemia Panel: No results for input(s): VITAMINB12, FOLATE,  FERRITIN, TIBC, IRON, RETICCTPCT in the last 72 hours. Urine analysis: No results found for: COLORURINE, APPEARANCEUR, LABSPEC, PHURINE, GLUCOSEU, HGBUR, BILIRUBINUR, KETONESUR, PROTEINUR, UROBILINOGEN, NITRITE, LEUKOCYTESUR Sepsis Labs: @LABRCNTIP (procalcitonin:4,lacticidven:4) )No results found for this or any previous visit (from the past 240 hour(s)).   Radiological Exams on Admission: Dg Chest 2 View  Result Date: 05/25/2018 CLINICAL DATA:  Subacute onset of left chest pressure for 3 weeks. EXAM: CHEST - 2 VIEW COMPARISON:  None. FINDINGS: The lungs are well-aerated. Mild peribronchial thickening is noted. There is no evidence of focal opacification, pleural effusion or pneumothorax. The heart is normal in size; the mediastinal contour is within normal limits. No acute osseous abnormalities are seen. IMPRESSION: Mild peribronchial thickening noted. Lungs otherwise clear. Electronically Signed   By: Garald Balding M.D.   On: 05/25/2018 14:00    EKG: Independently reviewed.  Normal sinus rhythm with a rate of 67 no significant ST changes  Assessment/Plan Principal Problem:   Chest pain Active Problems:   Essential hypertension     #1 chest pain: Patient has risk factors including hypertension and possibly hyperlipidemia.  Will admit for observation and cycle enzymes.  Initiate  heparin drip with nitroglycerin.  Patient may be a candidate for inpatient or outpatient cardiac stress testing if negative.  #2 hypertension: Resume blood pressure from home and adjust as necessary.  #3 hyperlipidemia: May need statin.   DVT prophylaxis: Heparin Code Status: Full Family Communication: No family available Disposition Plan: Home Consults called: None Admission status: Observation  Severity of Illness: The appropriate patient status for this patient is OBSERVATION. Observation status is judged to be reasonable and necessary in order to provide the required intensity of service to ensure the patient's safety. The patient's presenting symptoms, physical exam findings, and initial radiographic and laboratory data in the context of their medical condition is felt to place them at decreased risk for further clinical deterioration. Furthermore, it is anticipated that the patient will be medically stable for discharge from the hospital within 2 midnights of admission. The following factors support the patient status of observation.   " The patient's presenting symptoms include chest pain. " The physical exam findings include no significant findings on exam. " The initial radiographic and laboratory data are normal EKG and troponin.     Barbette Merino MD [facility] Pager [phone]  If 7PM-7AM, please contact night-coverage [email or web address] Password [email or web address]  05/26/2018, 6:01 AM

## 2018-05-25 NOTE — ED Triage Notes (Signed)
C/o left side chest pressure x 3 weeks-NAD-steady gait

## 2018-05-25 NOTE — Progress Notes (Signed)
Called from [facility] regarding this patient with h/o PVD with stent; HTN; and Crohn's disease presenting with chest pain.  No known cardiac disease.  He has had left-sided substernal CP for about a week, worse this AM and persistent and resolved spontaneously.  CXR negative.  Troponin negative x 1.  EKG non-acute.  Will accept to tele obs for CP r/o.  Carlyon Shadow, M.D.

## 2018-05-26 ENCOUNTER — Encounter (HOSPITAL_COMMUNITY): Payer: Self-pay | Admitting: Cardiology

## 2018-05-26 ENCOUNTER — Other Ambulatory Visit: Payer: Self-pay | Admitting: Cardiology

## 2018-05-26 DIAGNOSIS — I739 Peripheral vascular disease, unspecified: Secondary | ICD-10-CM

## 2018-05-26 DIAGNOSIS — R0789 Other chest pain: Secondary | ICD-10-CM

## 2018-05-26 DIAGNOSIS — I10 Essential (primary) hypertension: Secondary | ICD-10-CM | POA: Diagnosis not present

## 2018-05-26 LAB — CBC
HCT: 38.9 % — ABNORMAL LOW (ref 39.0–52.0)
Hemoglobin: 13.2 g/dL (ref 13.0–17.0)
MCH: 30.9 pg (ref 26.0–34.0)
MCHC: 33.9 g/dL (ref 30.0–36.0)
MCV: 91.1 fL (ref 80.0–100.0)
Platelets: 208 10*3/uL (ref 150–400)
RBC: 4.27 MIL/uL (ref 4.22–5.81)
RDW: 12.4 % (ref 11.5–15.5)
WBC: 8.2 10*3/uL (ref 4.0–10.5)
nRBC: 0 % (ref 0.0–0.2)

## 2018-05-26 LAB — TROPONIN I: Troponin I: <0.03 ng/mL (ref <0.03)

## 2018-05-26 LAB — APTT: aPTT: 55 seconds — ABNORMAL HIGH (ref 24–36)

## 2018-05-26 LAB — HEPARIN LEVEL (UNFRACTIONATED): Heparin Unfractionated: 0.3 IU/mL (ref 0.30–0.70)

## 2018-05-26 MED ORDER — AMLODIPINE BESYLATE 10 MG PO TABS: 10.0000 mg | ORAL_TABLET | Freq: Every day | ORAL | 0 refills | Status: AC

## 2018-05-26 MED ORDER — ASPIRIN 81 MG PO TABS: 81.0000 mg | ORAL_TABLET | Freq: Every day | ORAL | 0 refills | Status: AC

## 2018-05-26 NOTE — Progress Notes (Signed)
ANTICOAGULATION CONSULT NOTE - Follow Up Consult  Pharmacy Consult for heparin Indication: CP and h/o DVT/PVD  Labs: Recent Labs    05/25/18 1404 05/25/18 2118 05/25/18 2355 05/26/18 0255  HGB 13.9  --   --  13.2  HCT 42.6  --   --  38.9*  PLT 209  --   --  208  APTT  --   --   --  55*  HEPARINUNFRC  --   --   --  0.30  CREATININE 1.34*  --   --   --   TROPONINI <0.03 <0.03 <0.03 <0.03    Assessment: 78yo male subtherapeutic on heparin with initial dosing while Xarelto on hold; no gtt issues or signs of bleeding per RN.  Goal of Therapy:  Heparin level 0.3-0.7 units/ml aPTT 66-102 seconds   Plan:  Will increase heparin gtt by 2 units/kg/hr to 1250 units/hr and check level in 8 hours.    Wynona Neat, PharmD, BCPS  05/26/2018,6:07 AM

## 2018-05-26 NOTE — Consult Note (Addendum)
Cardiology Consultation:   Patient ID: Raquon Milledge MRN: [id number]; DOB: 1940/05/11  Admit date: 05/25/2018 Date of Consult: 05/26/2018  Primary Care Provider: Nicola Girt, DO Primary Cardiologist: Sinclair Grooms, MD  Primary Electrophysiologist:  None    Patient Profile:   Heath Tesler is a 78 y.o. male with a hx of PAD, hypertension, CKD stage III, esophageal reflux, DVT, Crohn's disease, right carotid bruit, arthritis who is being seen today for the evaluation of chest pain at the request of Dr. Lunette Stands.  History of Present Illness:   Mr. Sells reports that he has had about a 3-4-week history upper left chest intermittent pressure not associated with activity.  He has no associated shortness of breath, palpitations or lightheadedness.  When this occurs it lasts for a few minutes up to a few hours.  It is not related to any type of movement of his arm or chest.  The patient had tried to figure out what the cause was.  He had recently started taking probiotics so he stopped those but the discomfort persisted.  He tried doing extra exercise to see if that would bring on the discomfort but it did not.  He normally does exercise with light weights for his chest and arms.  He walks about 1/2 mile 3 times per week and has no exertional symptoms with his walking.  He is limited by leg claudication.  He notes that he has recently started protein shakes and has been belching more and is also lactose intolerant.  He wonders if his discomfort is related to gas.  After searching on the Internet at and trying to determine the cause of his symptoms, yesterday he finally decided to come to the hospital to get evaluated.  Patient does have some tenderness with palpation of the left lateral pectoral muscle area but he says this is different from the discomfort that has been concerning him.  The patient was evaluated for chest pain in 2015.  It was noted that the patient had a normal nuclear stress  test performed at [facility].  The patient came to [facility] and saw Dr. Tamala Julian for second opinion.  Dr. Tamala Julian noted that the patient's discomfort had not changed over the prior 1-1/2 years.  He was active and playing softball.  His only limitation in physical activity was right calf claudication.  The patient has a history of right superficial femoral artery stenting.  Patient was advised on aerobic activity and lipid management with LDL goal of <70.    Cardiac enzymes have been negative x4. EKG is nonischemic   Past Medical History:  Diagnosis Date  . Anemia   . Arthritis   . Benign neoplasm of colon   . Bursitis of right hip   . Carotid artery occlusion    right carotid bruit  . Crohn's disease ([place])   . DVT (deep venous thrombosis) ([place])   . Elevated liver enzymes   . Esophageal reflux   . Hypertension   . Insomnia, unspecified   . Kidney stone   . Peripheral arterial disease ([place])   . Peripheral vascular disease ([place])   . Varicose veins     Past Surgical History:  Procedure Laterality Date  . COLON SURGERY    . COLONOSCOPY  2009 and 2011  . ESOPHAGOGASTRODUODENOSCOPY  january  2014  . KNEE SURGERY Left   . TONSILLECTOMY       Home Medications:  Prior to Admission medications   Medication Sig  Start Date End Date Taking? Authorizing Provider  atorvastatin (LIPITOR) 10 MG tablet Take 10 mg by mouth daily.   Yes [provider]  cloNIDine (CATAPRES) 0.1 MG tablet Take 0.05 mg by mouth 2 (two) times daily.    Yes [provider]  famotidine (PEPCID) 40 MG tablet Take 40 mg by mouth daily.   Yes [provider]  finasteride (PROSCAR) 5 MG tablet TAKE 1 TABLET BY MOUTH EVERY DAY 09/14/16  Yes [provider]  olmesartan-hydrochlorothiazide (BENICAR HCT) 40-25 MG per tablet Take 1 tablet by mouth daily.   Yes [provider]  Turmeric Curcumin 500 MG CAPS Take 500 mg by mouth daily.   Yes [provider]   aspirin 81 MG tablet Take 81 mg by mouth daily.    [provider]  ferrous sulfate 325 (65 FE) MG tablet Take 325 mg by mouth daily with breakfast.    [provider]  Multiple Vitamins-Minerals (VITEYES AREDS ADVANCED PO) Take 1 tablet by mouth 2 (two) times daily.    [provider]  Probiotic Product ([place]) Take by mouth daily.    [provider]  zolpidem (AMBIEN) 5 MG tablet Take 5 mg by mouth at bedtime as needed for sleep.    [provider]    Inpatient Medications: Scheduled Meds: . aspirin EC  81 mg Oral Daily  . atorvastatin  10 mg Oral Daily  . cloNIDine  0.05 mg Oral BID  . famotidine  40 mg Oral Daily  . finasteride  5 mg Oral Daily  . irbesartan  300 mg Oral Daily   And  . hydrochlorothiazide  25 mg Oral Daily  . sodium chloride flush  3 mL Intravenous Once   Continuous Infusions: . heparin 1,250 Units/hr (05/26/18 1497)   PRN Meds: acetaminophen, ondansetron (ZOFRAN) IV, zolpidem  Allergies:   No Known Allergies  Social History:   Social History   Socioeconomic History  . Marital status: Married    Spouse name: Not on file  . Number of children: Not on file  . Years of education: Not on file  . Highest education level: Not on file  Occupational History  . Not on file  Social Needs  . Financial resource strain: Not on file  . Food insecurity:    Worry: Not on file    Inability: Not on file  . Transportation needs:    Medical: Not on file    Non-medical: Not on file  Tobacco Use  . Smoking status: Former Smoker    Last attempt to quit: 03/17/1983    Years since quitting: 35.2  . Smokeless tobacco: Never Used  Substance and Sexual Activity  . Alcohol use: Yes    Comment: occ  . Drug use: No  . Sexual activity: Not on file  Lifestyle  . Physical activity:    Days per week: Not on file    Minutes per session: Not on file  . Stress: Not on file  Relationships  . Social  connections:    Talks on phone: Not on file    Gets together: Not on file    Attends religious service: Not on file    Active member of club or organization: Not on file    Attends meetings of clubs or organizations: Not on file    Relationship status: Not on file  . Intimate partner violence:    Fear of current or ex partner: Not on file  Emotionally abused: Not on file    Physically abused: Not on file    Forced sexual activity: Not on file  Other Topics Concern  . Not on file  Social History Narrative  . Not on file    Family History:    Family History  Problem Relation Age of Onset  . Heart attack Mother        dies at age 1 of presumed MI, otherwise healthy  . Pneumonia Father        died at age 45 of pneumonia  . Lung cancer Sister      ROS:  Please see the history of present illness.   All other ROS reviewed and negative.     Physical Exam/Data:   Vitals:   05/25/18 1817 05/25/18 2053 05/26/18 0608 05/26/18 0813  BP: (!) 146/80 (!) 185/74 127/65 (!) 145/77  Pulse: 62 65 (!) 50   Resp: 18 18 18    Temp: 97.8 F (36.6 C) 98 F (36.7 C) 97.6 F (36.4 C)   TempSrc: Oral Oral Oral   SpO2: 100% 100% 98%   Weight:  86.9 kg 86.8 kg   Height:  5\' 11"  (1.803 m)      Intake/Output Summary (Last 24 hours) at 05/26/2018 1143 Last data filed at 05/26/2018 0500 Gross per 24 hour  Intake 76.57 ml  Output -  Net 76.57 ml   Last 3 Weights 05/26/2018 05/25/2018 05/25/2018  Weight (lbs) 191 lb 4.8 oz 191 lb 8 oz 197 lb  Weight (kg) 86.773 kg 86.864 kg 89.359 kg     Body mass index is 26.68 kg/m. General:  Well nourished, well developed, in no acute distress HEENT: normal Lymph: no adenopathy Neck: no JVD Endocrine:  No thryomegaly Vascular: No carotid bruits; FA pulses 2+ bilaterally without bruits  Cardiac:  normal S1, S2; RRR; no murmur  Lungs:  clear to auscultation bilaterally, no wheezing, rhonchi or rales  Abd: soft, nontender, no hepatomegaly  Ext: no  edema Musculoskeletal:  No deformities, BUE and BLE strength normal and equal Skin: warm and dry  Neuro:  CNs 2-12 intact, no focal abnormalities noted Psych:  Normal affect   EKG:  The EKG was personally reviewed and demonstrates: Normal sinus rhythm with no acute ischemia Telemetry:  Telemetry was personally reviewed and demonstrates:  Sinus bradycardia in the 40's-50's  Relevant CV Studies:    Laboratory Data:  Chemistry Recent Labs  Lab 05/25/18 1404  NA 135  K 3.8  CL 104  CO2 23  GLUCOSE 141*  BUN 21  CREATININE 1.34*  CALCIUM 9.2  GFRNONAA 51*  GFRAA 60*  ANIONGAP 8    No results for input(s): PROT, ALBUMIN, AST, ALT, ALKPHOS, BILITOT in the last 168 hours. Hematology Recent Labs  Lab 05/25/18 1404 05/26/18 0255  WBC 7.5 8.2  RBC 4.50 4.27  HGB 13.9 13.2  HCT 42.6 38.9*  MCV 94.7 91.1  MCH 30.9 30.9  MCHC 32.6 33.9  RDW 12.6 12.4  PLT 209 208   Cardiac Enzymes Recent Labs  Lab 05/25/18 1404 05/25/18 2118 05/25/18 2355 05/26/18 0255  TROPONINI <0.03 <0.03 <0.03 <0.03   No results for input(s): TROPIPOC in the last 168 hours.  BNPNo results for input(s): BNP, PROBNP in the last 168 hours.  DDimer No results for input(s): DDIMER in the last 168 hours.  Radiology/Studies:  Dg Chest 2 View  Result Date: 05/25/2018 CLINICAL DATA:  Subacute onset of left chest pressure for 3 weeks. EXAM: CHEST -  2 VIEW COMPARISON:  None. FINDINGS: The lungs are well-aerated. Mild peribronchial thickening is noted. There is no evidence of focal opacification, pleural effusion or pneumothorax. The heart is normal in size; the mediastinal contour is within normal limits. No acute osseous abnormalities are seen. IMPRESSION: Mild peribronchial thickening noted. Lungs otherwise clear. Electronically Signed   By: Garald Balding M.D.   On: 05/25/2018 14:00    Assessment and Plan:   Chest pain -The patient chest pain is mostly atypical in that it does not occur in relation  to activity and can last for several hours without any other associated symptoms. -Troponins negative x4 -EKG nonischemic -The patient was given aspirin 324 mg and started on a heparin drip.  He has eaten breakfast. -The patient has risk factors including hypertension, hyperlipidemia, known PAD and carotid artery stenosis.  He has no significant family history. -Can consider nuclear stress test inpt vs outpatient   PAD -Followed at [facility].  He has history of right superficial femoral artery stenting.  History of DVT -On Xarelto 20 mg daily  Hyperlipidemia -On atorvastatin 10 mg daily -No lipid levels are found in epic or care everywhere  Hypertension -On clonidine 0.1 mg twice daily, olmesartan-hydrochlorothiazide -BP is a little elevated     For questions or updates, please contact [facility] Please consult [email or web address] for contact info under     Signed, Daune Perch, NP  05/26/2018 11:43 AM Patient seen and examined and history reviewed. Agree with above findings and plan. 78 yo WM with history of PAD, carotid bruit, HTN, and HLD. On Xarelto chronically for history of DVT. Presents with 3-4 week history of intermittent chest pain. Describes pain in left axilla and mild 3/10 pressure in the left precordium. This is not related to activity, meals, position, cough. Completely random. Normally an active patient. Has medical care through [facility] but has seen Dr. Daneen Schick here in the past.   On exam he is in no distress. No JVD or bruits Lungs clear CV RRR without gallop or murmur Prominent LE varicosities. Feet warm  Ecg is normal. Troponin negative x 4 Creatinine 1.34.  Impression: 1. Atypical chest pressure but in patient with multiple risk factors. Has ruled out for MI. Not an ideal candidate for coronary CTA given renal dysfunction. I would recommend cardiac risk assessment with a stress Myoview. Offered to have this scheduled  today in hospital or DC home with outpatient stress testing. Patient prefers to go home and have this done as outpatient. We will arrange.  Juston Goheen [place], Sathvika Ojo 05/26/2018 11:44 AM

## 2018-05-26 NOTE — Discharge Summary (Signed)
Physician Discharge Summary  Ryan Richmond CVE:[id number] DOB: [birth year] DOA: 05/25/2018  PCP: Doug Sou B, DO  Admit date: 05/25/2018 Discharge date: 05/26/2018  Time spent: 35 minutes  Recommendations for Outpatient Follow-up:  1. Follow-up with the primary care physician in 1 week 2.   Follow-up with cardiology in 1 to 2 days for stress test  Discharge Diagnoses:  Principal Problem:   Chest pain Active Problems:   Essential hypertension   PAD (peripheral artery disease) ([place])   Discharge Condition: Stable  Diet recommendation: Cardiac  Filed Weights   05/25/18 1333 05/25/18 2053 05/26/18 0258  Weight: 89.4 kg 86.9 kg 86.8 kg   History of present illness and Hospital Course:  Ryan Richmond is a 78 y.o. male with medical history significant of peripheral vascular disease, hypertension, hyperlipidemia who was seen at [facility] secondary to left-sided chest pain on and off for 3 weeks but got worse today.  Pain rated as 7 out of 10.  More pressure.  No radiation.  Worsened by activities not relieved by rest.  Patient had no prior history of coronary artery disease.  No significant family history but has risk factors as indicated above.  He was therefore sent over for rule out MI.  He is still having 1 out of 10 pain at the moment.  Responded to nitroglycerin.  Patient denied any significant history of GERD..  ED Course: Blood pressure is 185/74 otherwise the rest of the vitals are stable.  BUN and creatinine is 21 and 1.34.  The regular CBC and chemistry appeared to be normal.  Initial cardiac enzymes negative.  EKG showed no significant findings.  Patient being admitted for observation and MI ruled out. Cardiac enzymes x4 came back to be negative.  Considering patient's risk factors of hypertension, hyperlipidemia, age, previous history of smoking consulted cardiology.  Patient is scheduled to have stress test done as an outpatient.  Patient has.'s of accelerated  hypertension most likely from the clonidine.  Patient is discharged with amlodipine.  Patient is recommended to follow-up with primary care physician in 1 week.    General exam: Appears calm and comfortable  Respiratory system: Clear to auscultation. Respiratory effort normal. Cardiovascular system: S1 & S2 heard, RRR. No JVD, murmurs, rubs, gallops or clicks. No pedal edema. Gastrointestinal system: Abdomen is nondistended, soft and nontender. No organomegaly or masses felt. Normal bowel sounds heard. Central nervous system: Alert and oriented. No focal neurological deficits. Extremities: Symmetric 5 x 5 power. Skin: No rashes, lesions or ulcers Psychiatry: Judgement and insight appear normal. Mood & affect appropriate     Consultations:  Cardiology  Discharge Exam: Vitals:   05/26/18 0608 05/26/18 0813  BP: 127/65 (!) 145/77  Pulse: (!) 50   Resp: 18   Temp: 97.6 F (36.4 C)   SpO2: 98%     Eyes: PERRL, lids and conjunctivae normal ENMT: Mucous membranes are moist. Posterior pharynx clear of any exudate or lesions.Normal dentition.  Neck: normal, supple, no masses, no thyromegaly Respiratory: clear to auscultation bilaterally, no wheezing, no crackles. Normal respiratory effort. No accessory muscle use.  Cardiovascular: Regular rate and rhythm, no murmurs / rubs / gallops. No extremity edema. 2+ pedal pulses. No carotid bruits.  Abdomen: no tenderness, no masses palpated. No hepatosplenomegaly. Bowel sounds positive.  Musculoskeletal: no clubbing / cyanosis. No joint deformity upper and lower extremities. Good ROM, no contractures. Normal muscle tone.  Skin: no rashes, lesions, ulcers. No induration Neurologic: CN 2-12 grossly intact. Sensation intact,  DTR normal. Strength 5/5 in all 4.  Psychiatric: Normal judgment and insight. Alert and oriented x 3. Normal mood.  Discharge Instructions   Discharge Instructions    Diet - low sodium heart healthy   Complete by:  As  directed    Increase activity slowly   Complete by:  As directed      Allergies as of 05/26/2018      Reactions   Lactose Intolerance (gi) Other (See Comments)   flatulence      Medication List    STOP taking these medications   cloNIDine 0.1 MG tablet Commonly known as:  CATAPRES   Turmeric Curcumin 500 MG Caps     TAKE these medications   amLODipine 10 MG tablet Commonly known as:  NORVASC Take 1 tablet (10 mg total) by mouth daily.   aspirin 81 MG tablet Take 1 tablet (81 mg total) by mouth daily for 30 days.   atorvastatin 10 MG tablet Commonly known as:  LIPITOR Take 10 mg by mouth at bedtime.   famotidine 40 MG tablet Commonly known as:  PEPCID Take 40 mg by mouth daily.   finasteride 5 MG tablet Commonly known as:  PROSCAR Take 5 mg by mouth every morning.   olmesartan-hydrochlorothiazide 40-25 MG tablet Commonly known as:  BENICAR HCT Take 1 tablet by mouth daily.   VITEYES AREDS ADVANCED PO Take 1 tablet by mouth 2 (two) times daily.   Xarelto 20 MG Tabs tablet Generic drug:  rivaroxaban Take 20 mg by mouth daily with supper.   zolpidem 5 MG tablet Commonly known as:  AMBIEN Take 5 mg by mouth at bedtime as needed for sleep.      Allergies  Allergen Reactions  . Lactose Intolerance ([place]) Other (See Comments)    flatulence   Follow-up Information    Nicola Girt, DO. Schedule an appointment as soon as possible for a visit in 1 day(s).   Specialty:  Internal Medicine Contact information: [address] [place] [address] [phone]        Belva Crome, MD .   Specialty:  Cardiology Contact information: [phone] [address] [place] [address] [phone]            The results of significant diagnostics from this hospitalization (including imaging, microbiology, ancillary and laboratory) are listed below for reference.    Significant Diagnostic Studies: Dg Chest 2 View  Result  Date: 05/25/2018 CLINICAL DATA:  Subacute onset of left chest pressure for 3 weeks. EXAM: CHEST - 2 VIEW COMPARISON:  None. FINDINGS: The lungs are well-aerated. Mild peribronchial thickening is noted. There is no evidence of focal opacification, pleural effusion or pneumothorax. The heart is normal in size; the mediastinal contour is within normal limits. No acute osseous abnormalities are seen. IMPRESSION: Mild peribronchial thickening noted. Lungs otherwise clear. Electronically Signed   By: Garald Balding M.D.   On: 05/25/2018 14:00    Microbiology: No results found for this or any previous visit (from the past 240 hour(s)).   Labs: Basic Metabolic Panel: Recent Labs  Lab 05/25/18 1404  NA 135  K 3.8  CL 104  CO2 23  GLUCOSE 141*  BUN 21  CREATININE 1.34*  CALCIUM 9.2   Liver Function Tests: No results for input(s): AST, ALT, ALKPHOS, BILITOT, PROT, ALBUMIN in the last 168 hours. No results for input(s): LIPASE, AMYLASE in the last 168 hours. No results for input(s): AMMONIA in the last 168  hours. CBC: Recent Labs  Lab 05/25/18 1404 05/26/18 0255  WBC 7.5 8.2  HGB 13.9 13.2  HCT 42.6 38.9*  MCV 94.7 91.1  PLT 209 208   Cardiac Enzymes: Recent Labs  Lab 05/25/18 1404 05/25/18 2118 05/25/18 2355 05/26/18 0255  TROPONINI <0.03 <0.03 <0.03 <0.03   BNP: BNP (last 3 results) No results for input(s): BNP in the last 8760 hours.  ProBNP (last 3 results) No results for input(s): PROBNP in the last 8760 hours.  CBG: No results for input(s): GLUCAP in the last 168 hours.     SignedMonica Becton MD.  [facility] 05/26/2018, 2:30 PM

## 2018-05-27 ENCOUNTER — Telehealth (HOSPITAL_COMMUNITY): Payer: Self-pay | Admitting: *Deleted

## 2018-05-27 NOTE — Telephone Encounter (Signed)
Patient given detailed instructions per Myocardial Perfusion Study Information Sheet for the test on 05/30/18 at 10:30. Patient notified to arrive 15 minutes early and that it is imperative to arrive on time for appointment to keep from having the test rescheduled.  If you need to cancel or reschedule your appointment, please call the office within 24 hours of your appointment. . Patient verbalized understanding.Ryan Richmond

## 2018-05-30 ENCOUNTER — Other Ambulatory Visit: Payer: Self-pay

## 2018-05-30 ENCOUNTER — Ambulatory Visit (HOSPITAL_COMMUNITY): Payer: Medicare Other | Attending: Cardiovascular Disease

## 2018-05-30 DIAGNOSIS — R0789 Other chest pain: Secondary | ICD-10-CM | POA: Diagnosis not present

## 2018-05-30 LAB — MYOCARDIAL PERFUSION IMAGING
CHL CUP MPHR: 145 {beats}/min
Estimated workload: 7 METS
Exercise duration (min): 6 min
Exercise duration (sec): 0 s
LV dias vol: 96 mL (ref 62–150)
LV sys vol: 39 mL
Peak HR: 141 {beats}/min
Percent HR: 97 %
Rest HR: 71 {beats}/min
SDS: 1
SRS: 0
SSS: 1
TID: 0.98

## 2018-05-30 MED ORDER — TECHNETIUM TC 99M TETROFOSMIN IV KIT
31.5000 | PACK | Freq: Once | INTRAVENOUS | Status: AC | PRN
  Administered 2018-05-30: 31.5 via INTRAVENOUS
  Filled 2018-05-30: qty 32

## 2018-05-30 MED ORDER — TECHNETIUM TC 99M TETROFOSMIN IV KIT
9.8000 | PACK | Freq: Once | INTRAVENOUS | Status: AC | PRN
  Administered 2018-05-30: 9.8 via INTRAVENOUS
  Filled 2018-05-30: qty 10

## 2018-06-02 ENCOUNTER — Telehealth: Payer: Self-pay | Admitting: Cardiology

## 2018-06-02 NOTE — Telephone Encounter (Signed)
Follow Up:    Returning Ryan Richmond 's call from yesterday, concerning his Stress Test results.

## 2019-02-19 ENCOUNTER — Emergency Department (HOSPITAL_BASED_OUTPATIENT_CLINIC_OR_DEPARTMENT_OTHER): Payer: Medicare Other

## 2019-02-19 ENCOUNTER — Emergency Department (HOSPITAL_BASED_OUTPATIENT_CLINIC_OR_DEPARTMENT_OTHER)
Admission: EM | Admit: 2019-02-19 | Discharge: 2019-02-20 | Disposition: A | Discharge status: Home / Self Care | Payer: Medicare Other | Attending: Emergency Medicine | Admitting: Emergency Medicine

## 2019-02-19 ENCOUNTER — Other Ambulatory Visit: Payer: Self-pay

## 2019-02-19 ENCOUNTER — Encounter (HOSPITAL_BASED_OUTPATIENT_CLINIC_OR_DEPARTMENT_OTHER): Payer: Self-pay

## 2019-02-19 DIAGNOSIS — Z79899 Other long term (current) drug therapy: Secondary | ICD-10-CM | POA: Insufficient documentation

## 2019-02-19 DIAGNOSIS — E739 Lactose intolerance, unspecified: Secondary | ICD-10-CM | POA: Diagnosis not present

## 2019-02-19 DIAGNOSIS — W11XXXA Fall on and from ladder, initial encounter: Secondary | ICD-10-CM | POA: Insufficient documentation

## 2019-02-19 DIAGNOSIS — I10 Essential (primary) hypertension: Secondary | ICD-10-CM | POA: Diagnosis not present

## 2019-02-19 DIAGNOSIS — W19XXXA Unspecified fall, initial encounter: Secondary | ICD-10-CM

## 2019-02-19 DIAGNOSIS — M549 Dorsalgia, unspecified: Secondary | ICD-10-CM

## 2019-02-19 DIAGNOSIS — Z87891 Personal history of nicotine dependence: Secondary | ICD-10-CM | POA: Diagnosis not present

## 2019-02-19 DIAGNOSIS — Y939 Activity, unspecified: Secondary | ICD-10-CM | POA: Diagnosis not present

## 2019-02-19 DIAGNOSIS — T1490XA Injury, unspecified, initial encounter: Secondary | ICD-10-CM

## 2019-02-19 DIAGNOSIS — S32019A Unspecified fracture of first lumbar vertebra, initial encounter for closed fracture: Secondary | ICD-10-CM | POA: Diagnosis not present

## 2019-02-19 DIAGNOSIS — S32029A Unspecified fracture of second lumbar vertebra, initial encounter for closed fracture: Secondary | ICD-10-CM | POA: Diagnosis not present

## 2019-02-19 DIAGNOSIS — Z86718 Personal history of other venous thrombosis and embolism: Secondary | ICD-10-CM | POA: Insufficient documentation

## 2019-02-19 DIAGNOSIS — Y999 Unspecified external cause status: Secondary | ICD-10-CM | POA: Diagnosis not present

## 2019-02-19 DIAGNOSIS — S3992XA Unspecified injury of lower back, initial encounter: Secondary | ICD-10-CM | POA: Diagnosis present

## 2019-02-19 DIAGNOSIS — S32039A Unspecified fracture of third lumbar vertebra, initial encounter for closed fracture: Secondary | ICD-10-CM | POA: Diagnosis not present

## 2019-02-19 DIAGNOSIS — Y929 Unspecified place or not applicable: Secondary | ICD-10-CM | POA: Insufficient documentation

## 2019-02-19 DIAGNOSIS — I2510 Atherosclerotic heart disease of native coronary artery without angina pectoris: Secondary | ICD-10-CM | POA: Insufficient documentation

## 2019-02-19 DIAGNOSIS — S32009A Unspecified fracture of unspecified lumbar vertebra, initial encounter for closed fracture: Secondary | ICD-10-CM

## 2019-02-19 LAB — URINALYSIS, MICROSCOPIC (REFLEX): Bacteria, UA: NONE SEEN

## 2019-02-19 LAB — CBC WITH DIFFERENTIAL/PLATELET
Abs Immature Granulocytes: 0.07 10*3/uL (ref 0.00–0.07)
Basophils Absolute: 0 10*3/uL (ref 0.0–0.1)
Basophils Relative: 0 %
Eosinophils Absolute: 0.1 10*3/uL (ref 0.0–0.5)
Eosinophils Relative: 1 %
HCT: 38 % — ABNORMAL LOW (ref 39.0–52.0)
Hemoglobin: 12.5 g/dL — ABNORMAL LOW (ref 13.0–17.0)
Immature Granulocytes: 1 %
Lymphocytes Relative: 23 %
Lymphs Abs: 2.6 10*3/uL (ref 0.7–4.0)
MCH: 30.6 pg (ref 26.0–34.0)
MCHC: 32.9 g/dL (ref 30.0–36.0)
MCV: 93.1 fL (ref 80.0–100.0)
Monocytes Absolute: 1.1 10*3/uL — ABNORMAL HIGH (ref 0.1–1.0)
Monocytes Relative: 10 %
Neutro Abs: 7.2 10*3/uL (ref 1.7–7.7)
Neutrophils Relative %: 65 %
Platelets: 235 10*3/uL (ref 150–400)
RBC: 4.08 MIL/uL — ABNORMAL LOW (ref 4.22–5.81)
RDW: 15.9 % — ABNORMAL HIGH (ref 11.5–15.5)
WBC: 11.1 10*3/uL — ABNORMAL HIGH (ref 4.0–10.5)
nRBC: 0 % (ref 0.0–0.2)

## 2019-02-19 LAB — COMPREHENSIVE METABOLIC PANEL
ALT: 27 U/L (ref 0–44)
AST: 27 U/L (ref 15–41)
Albumin: 4.2 g/dL (ref 3.5–5.0)
Alkaline Phosphatase: 74 U/L (ref 38–126)
Anion gap: 10 (ref 5–15)
BUN: 29 mg/dL — ABNORMAL HIGH (ref 8–23)
CO2: 25 mmol/L (ref 22–32)
Calcium: 9.1 mg/dL (ref 8.9–10.3)
Chloride: 104 mmol/L (ref 98–111)
Creatinine, Ser: 1.85 mg/dL — ABNORMAL HIGH (ref 0.61–1.24)
GFR calc Af Amer: 40 mL/min — ABNORMAL LOW (ref >60)
GFR calc non Af Amer: 35 mL/min — ABNORMAL LOW (ref >60)
Glucose, Bld: 109 mg/dL — ABNORMAL HIGH (ref 70–99)
Potassium: 4 mmol/L (ref 3.5–5.1)
Sodium: 139 mmol/L (ref 135–145)
Total Bilirubin: 0.7 mg/dL (ref 0.3–1.2)
Total Protein: 7.5 g/dL (ref 6.5–8.1)

## 2019-02-19 LAB — URINALYSIS, ROUTINE W REFLEX MICROSCOPIC
Bilirubin Urine: NEGATIVE
Glucose, UA: NEGATIVE mg/dL
Ketones, ur: NEGATIVE mg/dL
Leukocytes,Ua: NEGATIVE
Nitrite: NEGATIVE
Protein, ur: NEGATIVE mg/dL
Specific Gravity, Urine: 1.01 (ref 1.005–1.030)
pH: 5.5 (ref 5.0–8.0)

## 2019-02-19 MED ORDER — MORPHINE SULFATE (PF) 4 MG/ML IV SOLN
4.0000 mg | Freq: Once | INTRAVENOUS | Status: AC
  Administered 2019-02-19: 4 mg via INTRAVENOUS
  Filled 2019-02-19: qty 1

## 2019-02-19 MED ORDER — METHOCARBAMOL 500 MG PO TABS
500.0000 mg | ORAL_TABLET | Freq: Four times a day (QID) | ORAL | Status: DC | PRN
  Administered 2019-02-19: 500 mg via ORAL
  Filled 2019-02-19: qty 1

## 2019-02-19 MED ORDER — IOHEXOL 300 MG/ML  SOLN
100.0000 mL | Freq: Once | INTRAMUSCULAR | Status: AC | PRN
  Administered 2019-02-19: 80 mL via INTRAVENOUS

## 2019-02-19 MED ORDER — HYDROCODONE-ACETAMINOPHEN 5-325 MG PO TABS
1.0000 | ORAL_TABLET | Freq: Once | ORAL | Status: AC
  Administered 2019-02-19: 1 via ORAL
  Filled 2019-02-19: qty 1

## 2019-02-19 MED ORDER — FENTANYL CITRATE (PF) 100 MCG/2ML IJ SOLN
50.0000 ug | Freq: Once | INTRAMUSCULAR | Status: AC
  Administered 2019-02-19: 18:00:00 50 ug via INTRAVENOUS
  Filled 2019-02-19: qty 2

## 2019-02-19 MED ORDER — SODIUM CHLORIDE 0.9 % IV BOLUS
500.0000 mL | Freq: Once | INTRAVENOUS | Status: AC
  Administered 2019-02-19: 500 mL via INTRAVENOUS

## 2019-02-19 NOTE — Discharge Instructions (Signed)
Where your TLSO at all times when you are up. Get rechecked immediately if you develop any numbness or weakness in your legs or difficulty with emptying your bladder. Please call to follow-up with the neurosurgeon.

## 2019-02-19 NOTE — ED Notes (Addendum)
TLSO brace being applied by [employer] Lowe [employer], CP [facility])

## 2019-02-19 NOTE — ED Notes (Signed)
CT awaiting CMP results prior to scanning; per radiology protocol

## 2019-02-19 NOTE — Progress Notes (Signed)
Orthopedic Tech Progress Note Patient Details:  Ryan Richmond 02/12/1941 [id number]  Patient ID: Gwinda Passe, male   DOB: Feb 23, 1941, 78 y.o.   MRN: [id number] Got in touch with [employer] to put in order for brace.  Karolee Stamps 02/19/2019, 10:07 PM

## 2019-02-19 NOTE — ED Notes (Signed)
Pt updated

## 2019-02-19 NOTE — Progress Notes (Signed)
Called in regards to this patient who fell off of a 4 foot ladder landing on his back. He reports back pain but no radicular pain to the physician. CT lumbar show an anterior superior oblique fracture through the L1 body into the disc space at T12-L1 with no restropulsion or posterior element involvement. Given that he is neuro intact, would recommend upright xrays in a tlso clam shell brace to make sure the fracture is stable. Follow up in the office in a week once pain is under control.

## 2019-02-19 NOTE — ED Provider Notes (Signed)
[facility] EMERGENCY DEPARTMENT Provider Note   CSN: [id number] Arrival date & time: 02/19/19  1745     History   Chief Complaint Chief Complaint  Patient presents with   Fall    HPI Selwyn Vito is a 78 y.o. male.     The history is provided by the patient and medical records. No language interpreter was used.  Fall   Thailer Mucker is a 78 y.o. male who presents to the Emergency Department complaining of fall. He presents the emergency department for evaluation of injuries following a mechanical fall that happened one hour prior to ED arrival. He states that he was on a ladder that was on a staircase. The ladder slipped and he fell off the ladder about 4 feet, landing on a stair on his low back. No head injury or loss of consciousness. He reports severe pain to his low back that is nonradiating. Pain is worse with sitting and laying down. 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 He denies any recent illnesses. He denies any headache, neck pain, fevers, chest pain, abdominal pain, difficulty breathing, numbness, weakness. Past Medical History:  Diagnosis Date   Anemia    Arthritis    Benign neoplasm of colon    Bursitis of right hip    Carotid artery occlusion    right carotid bruit   Crohn's disease ([place])    DVT (deep venous thrombosis) ([facility])    Elevated liver enzymes    Esophageal reflux    Hypertension    Insomnia, unspecified    Kidney stone    Peripheral arterial disease ([place])    Peripheral vascular disease ([place])    Varicose veins     Patient Active Problem List   Diagnosis Date Noted   PAD (peripheral artery disease) ([place])    Chest pain 05/25/2018   Claudication ([place]) 07/31/2013   Carotid bruit 07/31/2013   Essential hypertension 07/31/2013   Atherosclerosis of native arteries of the extremities with intermittent claudication 04/19/2013    Past Surgical History:  Procedure Laterality Date   COLON SURGERY     COLONOSCOPY   2009 and 2011   ESOPHAGOGASTRODUODENOSCOPY  january  2014   KNEE SURGERY Left    TONSILLECTOMY          Home Medications    Prior to Admission medications   Medication Sig Start Date End Date Taking? Authorizing Provider  amLODipine (NORVASC) 10 MG tablet Take 1 tablet (10 mg total) by mouth daily. 05/26/18   Monica Becton, MD  atorvastatin (LIPITOR) 10 MG tablet Take 10 mg by mouth at bedtime.     [provider]  famotidine (PEPCID) 40 MG tablet Take 40 mg by mouth daily.    [provider]  finasteride (PROSCAR) 5 MG tablet Take 5 mg by mouth every morning.  09/14/16   [provider]  Multiple Vitamins-Minerals (VITEYES AREDS ADVANCED PO) Take 1 tablet by mouth 2 (two) times daily.    [provider]  olmesartan-hydrochlorothiazide (BENICAR HCT) 40-25 MG per tablet Take 1 tablet by mouth daily.    [provider]  XARELTO 20 MG TABS tablet Take 20 mg by mouth daily with supper. 05/17/18   [provider]  zolpidem (AMBIEN) 5 MG tablet Take 5 mg by mouth at bedtime as needed for sleep.    [provider]    Family History Family History  Problem Relation Age of Onset   Heart attack Mother  dies at age 66 of presumed MI, otherwise healthy   Pneumonia Father        died at age 69 of pneumonia   Lung cancer Sister     Social History Social History   Tobacco Use   Smoking status: Former Smoker    Quit date: 03/17/1983    Years since quitting: 35.9   Smokeless tobacco: Never Used  Substance Use Topics   Alcohol use: Yes    Comment: occ   Drug use: No     Allergies   Lactose intolerance (gi)   Review of Systems Review of Systems  All other systems reviewed and are negative.    Physical Exam Updated Vital Signs BP (!) 144/66 (BP Location: Right Arm)    Pulse 82    Temp 97.8 F (36.6 C) (Oral)    Resp 18    Ht 5\' 11"  (1.803 m)    Wt 87.1 kg    SpO2 97%    BMI 26.78 kg/m   Physical  Exam Vitals signs and nursing note reviewed.  Constitutional:      Appearance: He is well-developed.  HENT:     Head: Normocephalic and atraumatic.  Neck:     Musculoskeletal: Neck supple.  Cardiovascular:     Rate and Rhythm: Normal rate and regular rhythm.     Heart sounds: No murmur.  Pulmonary:     Effort: Pulmonary effort is normal. No respiratory distress.     Breath sounds: Normal breath sounds.  Abdominal:     Palpations: Abdomen is soft.     Tenderness: There is no abdominal tenderness. There is no guarding or rebound.  Musculoskeletal:        General: No tenderness.     Comments: Abrasion over midline lower lumbar spine without focal tenderness. 2+ DP pulses bilaterally. One plus pitting edema to the right lower extremity, trace pitting edema to the left lower extremity. No tenderness to palpation over the hips bilaterally.  Skin:    General: Skin is warm and dry.  Neurological:     Mental Status: He is alert and oriented to person, place, and time.     Comments: Five out of five strength in all four extremities with sensation to light touch intact in all four extremities  Psychiatric:        Behavior: Behavior normal.      ED Treatments / Results  Labs (all labs ordered are listed, but only abnormal results are displayed) Labs Reviewed  COMPREHENSIVE METABOLIC PANEL - Abnormal; Notable for the following components:      Result Value   Glucose, Bld 109 (*)    BUN 29 (*)    Creatinine, Ser 1.85 (*)    GFR calc non Af Amer 35 (*)    GFR calc Af Amer 40 (*)    All other components within normal limits  CBC WITH DIFFERENTIAL/PLATELET - Abnormal; Notable for the following components:   WBC 11.1 (*)    RBC 4.08 (*)    Hemoglobin 12.5 (*)    HCT 38.0 (*)    RDW 15.9 (*)    Monocytes Absolute 1.1 (*)    All other components within normal limits  URINALYSIS, ROUTINE W REFLEX MICROSCOPIC    EKG None  Radiology Dg Chest 2 View  Result Date:  02/19/2019 CLINICAL DATA:  Pain status post fall EXAM: CHEST - 2 VIEW COMPARISON:  May 25, 2018 FINDINGS: There is an airspace opacity at the right lung base. There  is no pneumothorax. No large pleural effusion. The heart size is stable from prior study. There is no acute osseous abnormality. IMPRESSION: 1. Right lower lobe airspace disease. Findings may represent atelectasis or pneumonia. Follow-up to radiologic resolution is recommended. 2. No pneumothorax or large pleural effusion. Electronically Signed   By: Constance Holster M.D.   On: 02/19/2019 19:30   Ct Abdomen Pelvis W Contrast  Result Date: 02/19/2019 CLINICAL DATA:  78 year old male with fall and low back pain. EXAM: CT ABDOMEN AND PELVIS WITH CONTRAST TECHNIQUE: Multidetector CT imaging of the abdomen and pelvis was performed using the standard protocol following bolus administration of intravenous contrast. CONTRAST:  57mL OMNIPAQUE IOHEXOL 300 MG/ML  SOLN COMPARISON:  Lumbar spine CT dated 02/19/2019. FINDINGS: Lower chest: Minimal bibasilar dependent atelectasis. The visualized lung bases are otherwise clear. There is coronary vascular calcification. No intra-abdominal free air or free fluid. Hepatobiliary: Probable mild fatty infiltration of the liver. No intrahepatic biliary ductal dilatation. There is a 1 cm stone in the gallbladder. No pericholecystic fluid or evidence of acute cholecystitis by CT. Pancreas: Unremarkable. No pancreatic ductal dilatation or surrounding inflammatory changes. Spleen: Normal in size without focal abnormality. Adrenals/Urinary Tract: The adrenal glands are unremarkable. 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 There is hypoenhancement of this portion of the right kidney. A subcentimeter right renal interpolar hypodense lesion is too small to characterize but likely represents a cyst. Several small  nonobstructing left renal calculi measure 3-4 mm in size. There is no hydronephrosis on either side. There is symmetric enhancement and excretion of contrast by both kidneys. A segmental areas of decreased enhancement in the interpolar aspect of the kidneys bilaterally may be artifactual. Correlation with urinalysis recommended to exclude UTI. The visualized ureters appear unremarkable. The urinary bladder is mildly distended. There is mild trabeculated appearance of the bladder wall, likely related to chronic bladder outlet obstruction. Correlation with urinalysis recommended to exclude cystitis. Stomach/Bowel: Postsurgical changes of right hemicolectomy with ileotransverse anastomosis. There is moderate stool throughout the colon. There is no bowel obstruction or active inflammation. Appendectomy. Right lower quadrant ileal anastomosis is also noted. Vascular/Lymphatic: There is mild aortoiliac atherosclerotic disease. The IVC is unremarkable. No portal venous gas. There is no adenopathy. Retroperitoneal edema and stranding secondary to lumbar spine fracture. No large hematoma or fluid collection. Reproductive: Enlarged prostate gland measuring approximately 5.5 cm in transverse axial diameter. The seminal vesicles are symmetric. Other: Small fat containing umbilical hernia. Musculoskeletal: Nondisplaced fracture of the superior anterior L1 vertebra with extension of the fracture into the superior endplate and anterior L1 cortex. No significant reduction in the height of the vertebral body. No retropulsed fragment. The visualized posterior elements appear intact. No paravertebral fluid collection or hematoma. Nondisplaced fractures of the right L2-L3 transverse processes. There is multilevel degenerative changes with disc desiccation and vacuum phenomena most prominent at L4-L5. 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 IMPRESSION: 1. Nondisplaced  fracture of the superior anterior L1 vertebra with extension of the fracture into the superior endplate and anterior cortex of the L1 vertebral body. No significant reduction in the height of the vertebral body. No retropulsed fragment. 2. Nondisplaced right L2 and L3 transverse process fractures. 3. Cholelithiasis. 4. Postsurgical changes of right hemicolectomy with ileotransverse anastomosis. No bowel obstruction or active inflammation. 5. Several nonobstructing left renal calculi.  No hydronephrosis. 6.  Enlarged prostate gland with findings of chronic bladder outlet obstruction. Correlation with urinalysis recommended to exclude cystitis. 7. Aortic Atherosclerosis (ICD10-I70.0). Electronically Signed   By: Anner Crete M.D.   On: 02/19/2019 19:29   Ct L-spine No Charge  Result Date: 02/19/2019 CLINICAL DATA:  78 year old male status post fall from 4 ft ladder today landing on back. Low back pain. EXAM: CT LUMBAR SPINE WITH CONTRAST TECHNIQUE: Technique: Multiplanar CT images of the lumbar spine were reconstructed from contemporary CT of the Abdomen and Pelvis. CONTRAST:  No additional COMPARISON:  CT Abdomen and Pelvis today are reported separately. CT Abdomen and Pelvis 07/06/2016. FINDINGS: Segmentation: Normal. Alignment: Stable straightening of lumbar lordosis since 2018. No spondylolisthesis. Vertebrae: There are nondisplaced to minimally displaced fractures of the right L1, L2, and L3 transverse processes. There is a superimposed mildly comminuted and curvilinear fracture through the anterior superior L1 vertebral body and superior endplate, tracking posteriorly and superiorly into the disc space (series 11, image 56). An adjacent chronic right lateral T12-L1 endplate osteophyte might also be acutely fractured (coronal image 30). But the T12 vertebra, pedicles, and lamina at both levels appear to remain intact. Visible T10 and T11 levels also appear intact. No other lumbar vertebral body fracture  identified. The L4 and L5 levels appear intact. Intact visible sacrum and SI joints. Paraspinal and other soft tissues: Abdominal and pelvic viscera reported separately today. There is prevertebral soft tissue contusion and/or edema at the T12-L1 level as seen on series 9, image 31. No discrete hematoma. The other paraspinal soft tissues appear negative. Disc levels: No degenerative lumbar spinal stenosis. Fairly mild for age degenerative lumbar changes appear stable since 2018. IMPRESSION: 1. Positive for an oblique fracture through the anterior superior L1 body which tracks into the T12-L1 disc space. However, there is NO posterior element injury extension evident at either T12 or L1 to indicate a Chance type fracture. Mild associated prevertebral soft tissue contusion or edema. 2. Superimposed right L1 through L3 transverse process fractures. 3. No other acute traumatic injury identified in the lumbar spine. 4.  CT Abdomen and Pelvis today are reported separately. Electronically Signed   By: Genevie Ann M.D.   On: 02/19/2019 19:29    Procedures Procedures (including critical care time)  Medications Ordered in ED Medications  methocarbamol (ROBAXIN) tablet 500 mg (500 mg Oral Given 02/19/19 2102)  fentaNYL (SUBLIMAZE) injection 50 mcg (50 mcg Intravenous Given 02/19/19 1808)  sodium chloride 0.9 % bolus 500 mL (0 mLs Intravenous Stopped 02/19/19 2104)  iohexol (OMNIPAQUE) 300 MG/ML solution 100 mL (80 mLs Intravenous Contrast Given 02/19/19 1840)  morphine 4 MG/ML injection 4 mg (4 mg Intravenous Given 02/19/19 1925)  HYDROcodone-acetaminophen (NORCO/VICODIN) 5-325 MG per tablet 1 tablet (1 tablet Oral Given 02/19/19 2102)     Initial Impression / Assessment and Plan / ED Course  I have reviewed the triage vital signs and the nursing notes.  Pertinent labs & imaging results that were available during my care of the patient were reviewed by me and considered in my medical decision making (see chart for  details).        Patient here for evaluation of injuries following a mechanical fall. He does have an abrasion tenderness to his mid and lower lumbar spine. CT scan obtained to rule out retroperitoneal bleed. CT scan demonstrates L1 fracture, L2 and L3 to transverse process fractures. He is neurovascularly intact on evaluation. Discussed with Myron with neurosurgery, recommends TLSO at all times when the patient  is up. Patient will need repeat plain films standing of the lumbar spine and is TLSO prior to ED discharge. Patient updated of findings of studies and treatment plan and he is in agreement. Patient care transferred pending TLSO and additional images.  Final Clinical Impressions(s) / ED Diagnoses   Final diagnoses:  Back pain  Closed fracture of first lumbar vertebra, unspecified fracture morphology, initial encounter [facility])  Closed fracture of transverse process of lumbar vertebra, initial encounter [facility])    ED Discharge Orders    None       Quintella Reichert, MD 02/20/19 0106

## 2019-02-19 NOTE — ED Notes (Signed)
Spoke with ortho tech and they will call the rep regarding the patient needing a TLSO brace. They will contact the Rep for [place].

## 2019-02-19 NOTE — ED Notes (Signed)
ED Provider at bedside. 

## 2019-02-19 NOTE — ED Triage Notes (Signed)
Pt states that he fell from about a 4 ft ladder today, landing on his lower back. Pt denies LOC, denies hitting head, is on Xarelto. Ambulated to room, offered wheelchair, pt declined.

## 2019-02-20 DIAGNOSIS — S32019A Unspecified fracture of first lumbar vertebra, initial encounter for closed fracture: Secondary | ICD-10-CM | POA: Diagnosis not present

## 2019-02-20 MED ORDER — HYDROCODONE-ACETAMINOPHEN 5-325 MG PO TABS
1.0000 | ORAL_TABLET | Freq: Once | ORAL | Status: AC
  Administered 2019-02-20: 1 via ORAL
  Filled 2019-02-20: qty 1

## 2019-02-20 MED ORDER — HYDROCODONE-ACETAMINOPHEN 5-325 MG PO TABS: 1.0000 | ORAL_TABLET | ORAL | 0 refills | Status: AC | PRN

## 2019-02-20 NOTE — ED Notes (Signed)
Contacted Neurosurgery (Dr. Saintclair Halsted) @ [phone]

## 2019-03-30 ENCOUNTER — Other Ambulatory Visit: Payer: Self-pay | Admitting: Student

## 2019-03-30 DIAGNOSIS — S32018A Other fracture of first lumbar vertebra, initial encounter for closed fracture: Secondary | ICD-10-CM

## 2019-04-17 ENCOUNTER — Ambulatory Visit
Admission: RE | Admit: 2019-04-17 | Discharge: 2019-04-17 | Disposition: A | Discharge status: Home / Self Care | Payer: Medicare Other | Source: Ambulatory Visit | Attending: Student | Admitting: Student

## 2019-04-17 DIAGNOSIS — S32018A Other fracture of first lumbar vertebra, initial encounter for closed fracture: Secondary | ICD-10-CM

## 2021-03-03 IMAGING — CT CT L SPINE W/O CM
3 of 9 series · 14 of 34 positions shown, 16 images · IV contrast (Omnipaque)
Comparison: CT Abdomen and Pelvis today are reported separately. CT
Abdomen and Pelvis 07/06/2016.

CLINICAL DATA: 78-year-old male status post fall from 4 ft ladder
today landing on back. Low back pain.

EXAM:
CT LUMBAR SPINE WITH CONTRAST
TECHNIQUE: 
TECHNIQUE: Multiplanar CT images of the lumbar spine were
reconstructed from contemporary CT of the Abdomen and Pelvis.
CONTRAST:  No additional

[Series 3: thins · axial · 0.77mm/px · z∈[+186,+617]mm · 8 of 1110 slices shown, 10 images]
[im 124/1110  soft-tissue]
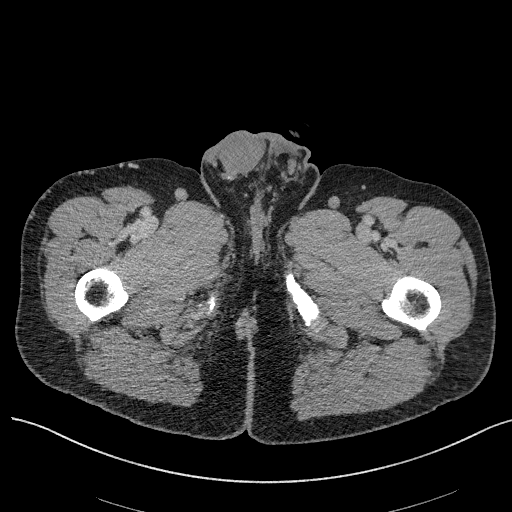
[im 124/1110  bone]
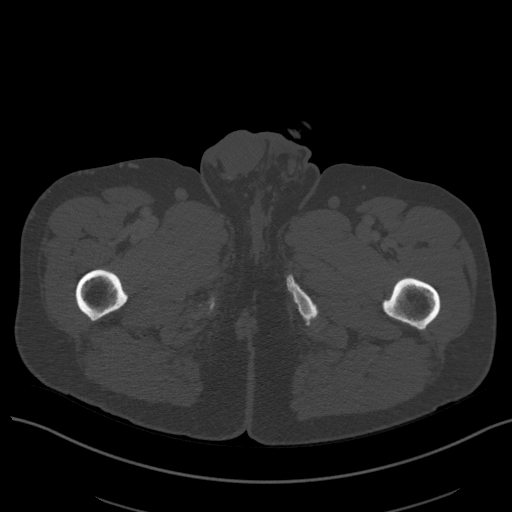
[im 247/1110  bone]
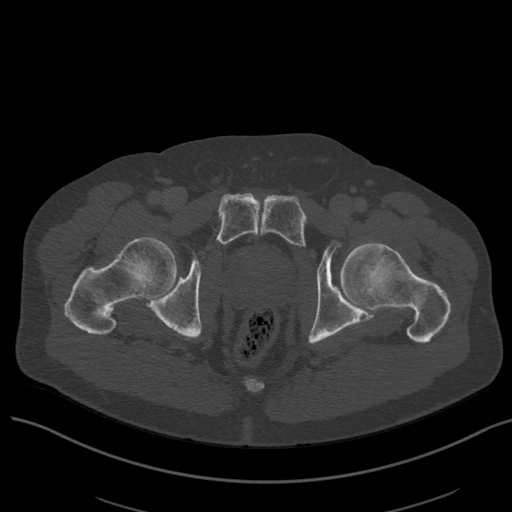
[im 370/1110  bone]
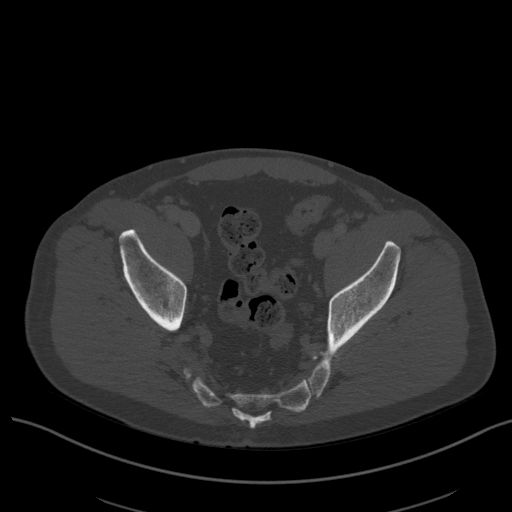
[im 493/1110  bone]
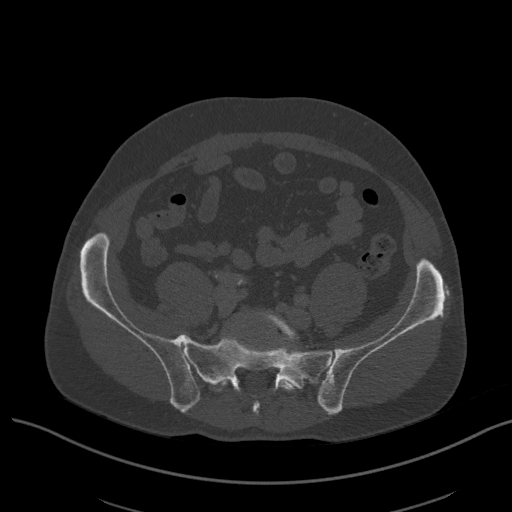
[im 617/1110  soft-tissue]
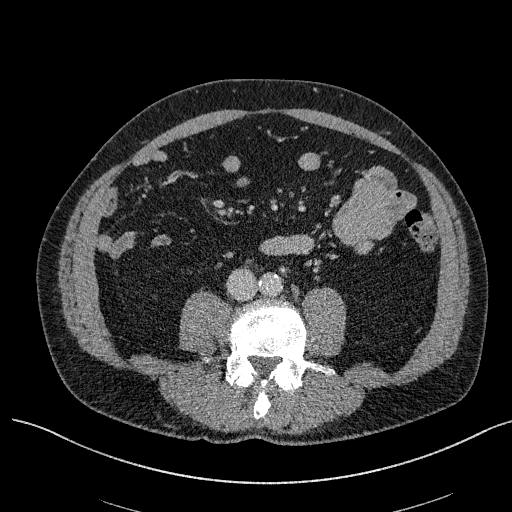
[im 617/1110  bone]
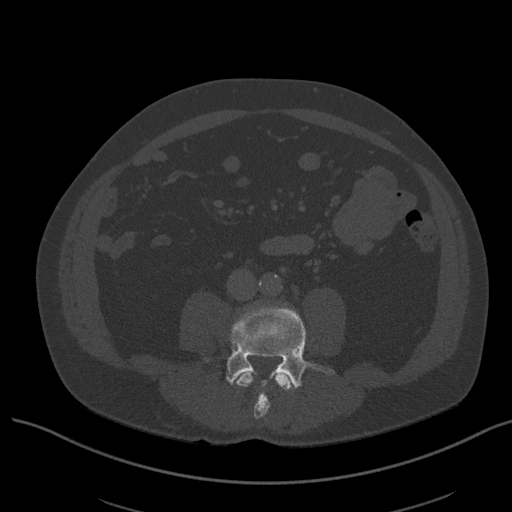
[im 740/1110  bone]
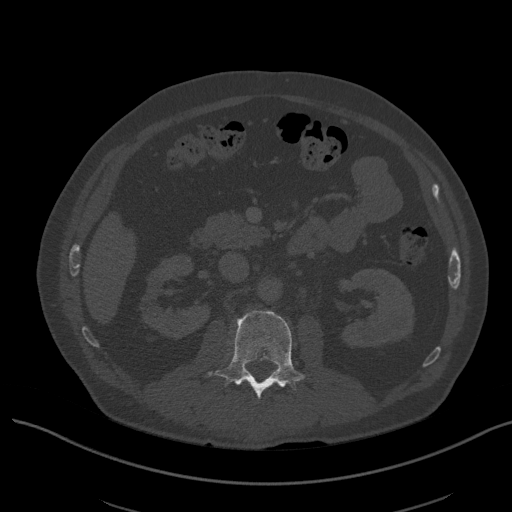
[im 863/1110  bone]
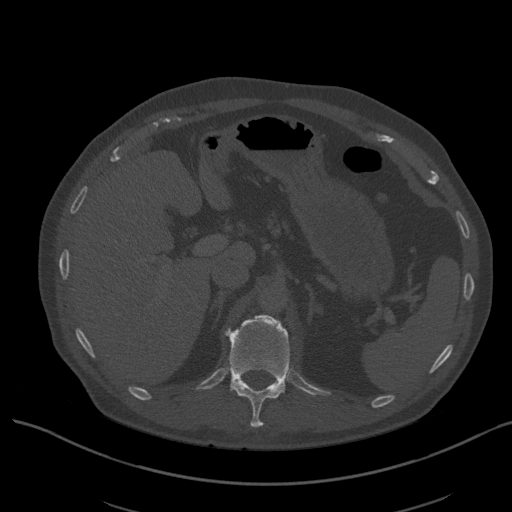
[im 986/1110  bone]
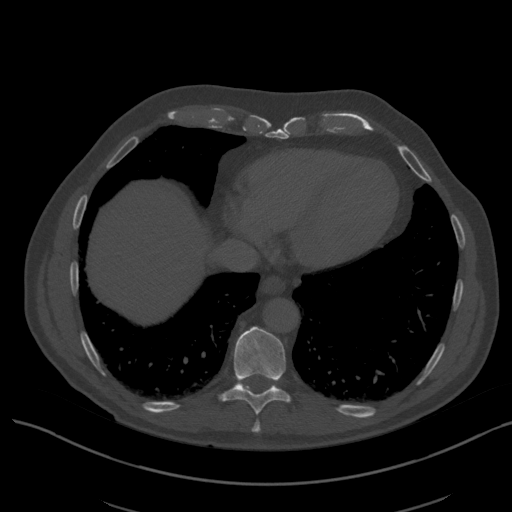

[Series 5: coronal st · coronal · 0.96mm/px · 1 of 101 slices shown]
[im 51/101  bone]
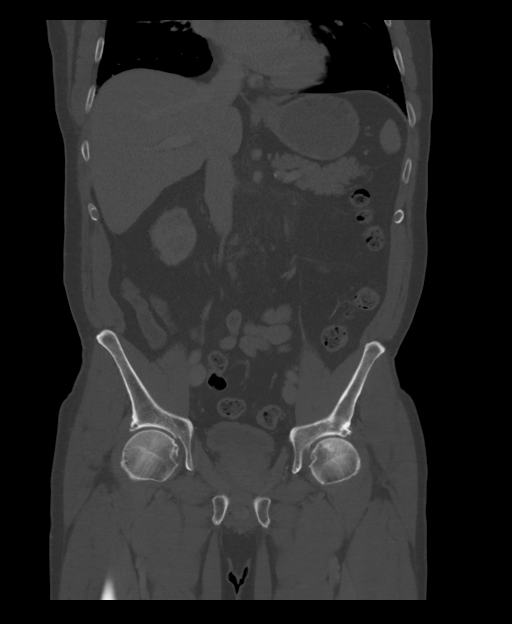

[Series 6: sagittal st · sagittal · 0.86mm/px · 5 of 111 slices shown]
[im 19/111  bone]
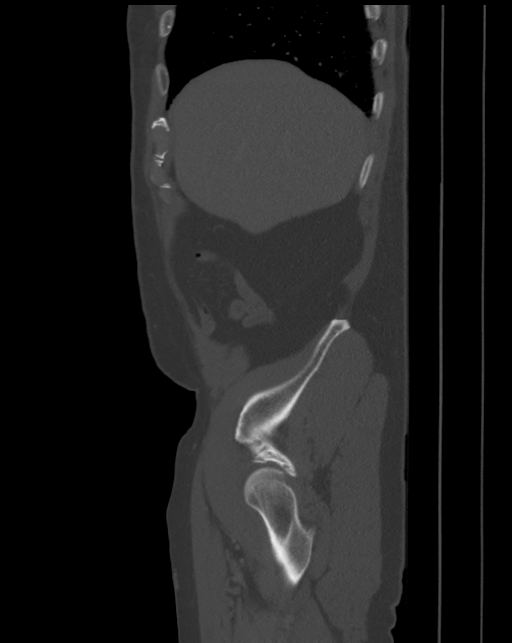
[im 37/111  bone]
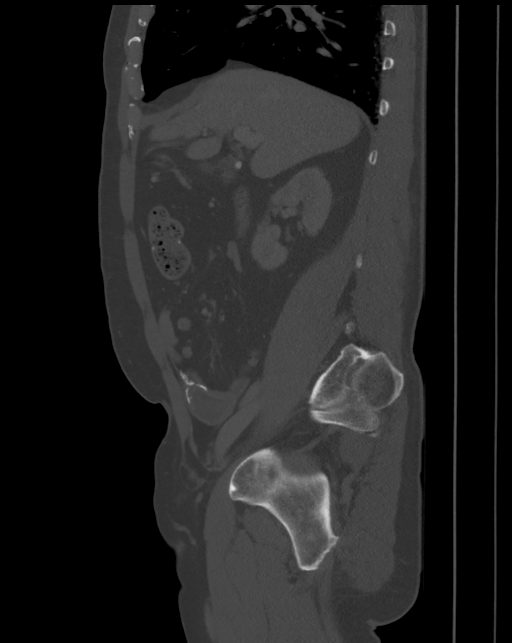
[im 56/111  bone]
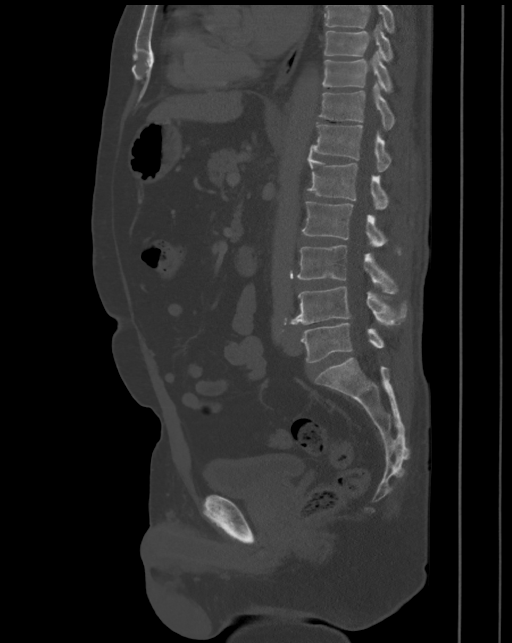
[im 74/111  bone]
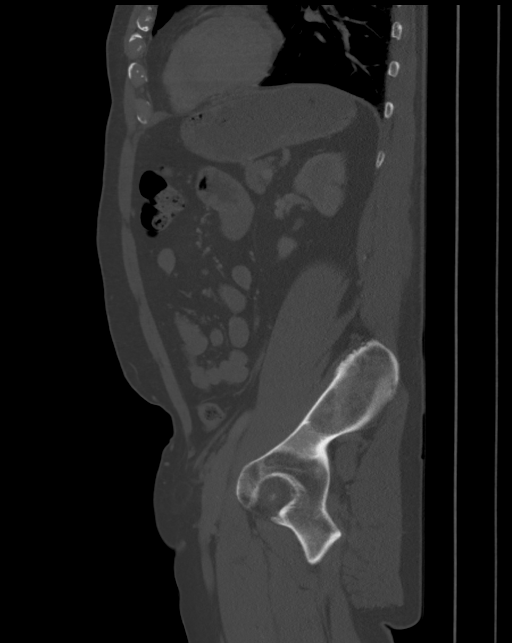
[im 92/111  bone]
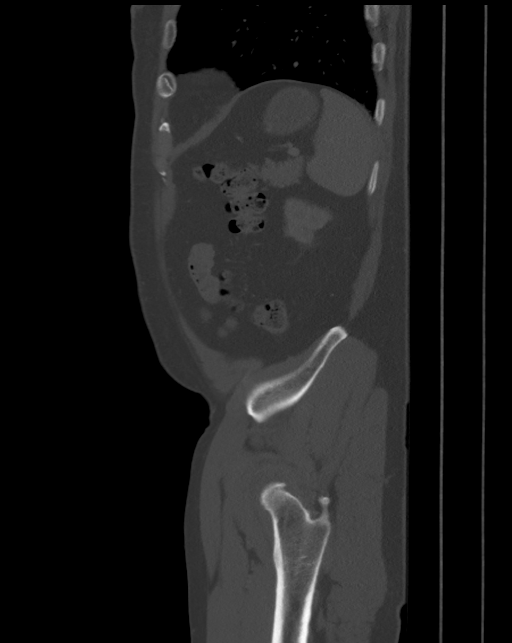

[14 of 34 positions shown; findings below may reference images not displayed]

FINDINGS: Segmentation: Normal.

Alignment: Stable straightening of lumbar lordosis since 0580. No
spondylolisthesis.

Vertebrae: There are nondisplaced to minimally displaced fractures
of the right L1, L2, and L3 transverse processes.

There is a superimposed mildly comminuted and curvilinear fracture
through the anterior superior L1 vertebral body and superior
endplate, tracking posteriorly and superiorly into the disc space
(series 11, image 56). An adjacent chronic right lateral T12-L1
endplate osteophyte might also be acutely fractured (coronal image
30). But the T12 vertebra, pedicles, and lamina at both levels
appear to remain intact.

Visible T10 and T11 levels also appear intact. No other lumbar
vertebral body fracture identified. The L4 and L5 levels appear
intact. Intact visible sacrum and SI joints.

Paraspinal and other soft tissues: Abdominal and pelvic viscera
reported separately today. There is prevertebral soft tissue
contusion and/or edema at the T12-L1 level as seen on series 9,
image 31. No discrete hematoma. The other paraspinal soft tissues
appear negative.

Disc levels: No degenerative lumbar spinal stenosis. Fairly mild for
age degenerative lumbar changes appear stable since 0580.
IMPRESSION: 1. Positive for an oblique fracture through the anterior superior L1
body which tracks into the T12-L1 disc space. However, there is NO
posterior element injury extension evident at either T12 or L1 to
indicate a Chance type fracture. Mild associated prevertebral soft
tissue contusion or edema.
2. Superimposed right L1 through L3 transverse process fractures.
3. No other acute traumatic injury identified in the lumbar spine.
4.  CT Abdomen and Pelvis today are reported separately.
